# Patient Record
Sex: Male | Born: 1955 | Race: Black or African American | Hispanic: No | Smoking: Former smoker
Health system: Southern US, Community
[De-identification: ages and names within clinical notes are randomized; demographics above are authoritative.]

## PROBLEM LIST (undated history)

## (undated) DIAGNOSIS — E119 Type 2 diabetes mellitus without complications: Secondary | ICD-10-CM

## (undated) DIAGNOSIS — L409 Psoriasis, unspecified: Secondary | ICD-10-CM

---

## 1999-12-15 ENCOUNTER — Emergency Department (HOSPITAL_COMMUNITY): Admission: EM | Admit: 1999-12-15 | Discharge: 1999-12-15 | Payer: Self-pay

## 2009-11-10 ENCOUNTER — Ambulatory Visit: Payer: Self-pay | Admitting: Internal Medicine

## 2009-11-10 LAB — CONVERTED CEMR LAB
ALT: 14 units/L (ref 0–53)
AST: 17 units/L (ref 0–37)
Albumin: 4.3 g/dL (ref 3.5–5.2)
Alkaline Phosphatase: 55 units/L (ref 39–117)
Anti Nuclear Antibody(ANA): NEGATIVE
BUN: 8 mg/dL (ref 6–23)
CO2: 27 meq/L (ref 19–32)
CRP: 0.8 mg/dL — ABNORMAL HIGH (ref <0.6)
Calcium: 9.5 mg/dL (ref 8.4–10.5)
Chloride: 102 meq/L (ref 96–112)
Creatinine, Ser: 1.09 mg/dL (ref 0.40–1.50)
Folate: 7.8 ng/mL
Glucose, Bld: 108 mg/dL — ABNORMAL HIGH (ref 70–99)
HCV Ab: NEGATIVE
Hep A Total Ab: NEGATIVE
Hep B Core Total Ab: NEGATIVE
Hep B S Ab: POSITIVE — AB
Hgb A1c MFr Bld: 6.9 % — ABNORMAL HIGH (ref 4.6–6.1)
Iron: 95 ug/dL (ref 42–165)
Potassium: 4.2 meq/L (ref 3.5–5.3)
Saturation Ratios: 35 % (ref 20–55)
Sodium: 141 meq/L (ref 135–145)
TIBC: 270 ug/dL (ref 215–435)
TSH: 2.354 microintl units/mL (ref 0.350–4.500)
Total Bilirubin: 0.3 mg/dL (ref 0.3–1.2)
Total Protein: 7.7 g/dL (ref 6.0–8.3)
UIBC: 175 ug/dL
Uric Acid, Serum: 9.1 mg/dL — ABNORMAL HIGH (ref 4.0–7.8)
Vitamin B-12: 731 pg/mL (ref 211–911)

## 2009-12-08 ENCOUNTER — Ambulatory Visit: Payer: Self-pay | Admitting: Internal Medicine

## 2009-12-12 ENCOUNTER — Ambulatory Visit: Payer: Self-pay | Admitting: Internal Medicine

## 2009-12-29 ENCOUNTER — Ambulatory Visit: Payer: Self-pay | Admitting: Internal Medicine

## 2010-01-26 ENCOUNTER — Ambulatory Visit: Payer: Self-pay | Admitting: Internal Medicine

## 2010-01-26 LAB — CONVERTED CEMR LAB: Microalb, Ur: 1.08 mg/dL (ref 0.00–1.89)

## 2010-04-02 ENCOUNTER — Ambulatory Visit: Payer: Self-pay | Admitting: Internal Medicine

## 2010-04-02 LAB — CONVERTED CEMR LAB
Cholesterol: 205 mg/dL — ABNORMAL HIGH (ref 0–200)
HDL: 42 mg/dL (ref >39)
Hgb A1c MFr Bld: 6.8 % — ABNORMAL HIGH (ref <5.7)
LDL Cholesterol: 119 mg/dL — ABNORMAL HIGH (ref 0–99)
PSA: 0.46 ng/mL (ref 0.10–4.00)
Testosterone: 340.96 ng/dL — ABNORMAL LOW (ref 350–890)
Total CHOL/HDL Ratio: 4.9
Triglycerides: 218 mg/dL — ABNORMAL HIGH (ref <150)
VLDL: 44 mg/dL — ABNORMAL HIGH (ref 0–40)

## 2010-04-05 ENCOUNTER — Emergency Department (HOSPITAL_COMMUNITY): Admission: EM | Admit: 2010-04-05 | Discharge: 2010-04-05 | Payer: Self-pay | Admitting: Emergency Medicine

## 2010-08-03 ENCOUNTER — Emergency Department (HOSPITAL_COMMUNITY)
Admission: EM | Admit: 2010-08-03 | Discharge: 2010-08-03 | Payer: Self-pay | Source: Home / Self Care | Admitting: Emergency Medicine

## 2010-10-22 LAB — URINALYSIS, ROUTINE W REFLEX MICROSCOPIC
Bilirubin Urine: NEGATIVE
Glucose, UA: NEGATIVE mg/dL
Ketones, ur: NEGATIVE mg/dL
Nitrite: NEGATIVE
Protein, ur: NEGATIVE mg/dL
Specific Gravity, Urine: 1.018 (ref 1.005–1.030)
Urobilinogen, UA: 1 mg/dL (ref 0.0–1.0)
pH: 5.5 (ref 5.0–8.0)

## 2010-10-22 LAB — URINE CULTURE

## 2010-10-22 LAB — URINE MICROSCOPIC-ADD ON

## 2014-06-07 ENCOUNTER — Emergency Department (HOSPITAL_COMMUNITY)
Admission: EM | Admit: 2014-06-07 | Discharge: 2014-06-07 | Disposition: A | Discharge status: Home / Self Care | Payer: Self-pay | Attending: Emergency Medicine | Admitting: Emergency Medicine

## 2014-06-07 ENCOUNTER — Encounter (HOSPITAL_COMMUNITY): Payer: Self-pay | Admitting: Emergency Medicine

## 2014-06-07 DIAGNOSIS — L0211 Cutaneous abscess of neck: Secondary | ICD-10-CM | POA: Insufficient documentation

## 2014-06-07 DIAGNOSIS — Z87891 Personal history of nicotine dependence: Secondary | ICD-10-CM | POA: Insufficient documentation

## 2014-06-07 MED ORDER — SULFAMETHOXAZOLE-TRIMETHOPRIM 800-160 MG PO TABS: 1.0000 | ORAL_TABLET | Freq: Two times a day (BID) | ORAL | Status: AC

## 2014-06-07 NOTE — ED Notes (Signed)
Pt presents with abscess to L side of neck.  Pt reports getting bit by insect but did not see insect or feel a bite.  Pt reports drainage to area.

## 2014-06-07 NOTE — ED Provider Notes (Signed)
CSN: [id number]     Arrival date & time 06/07/14  1038 History  This chart was scribed for non-physician practitioner, Trixie DredgeEmily Marelly Wehrman, PA-C working with Flint MelterElliott L Wentz, MD by Greggory StallionKayla Andersen, ED scribe. This patient was seen in room TR09C/TR09C and the patient's care was started at 10:53 AM.   Chief Complaint  Patient presents with  . Insect Bite   The history is provided by the patient. No language interpreter was used.   HPI Comments: Casey Bell is a 58 y.o. male who presents to the Emergency Department complaining of an insect bite to his left neck that occurred 3 days ago. Pt states he never actually saw an insect but felt something bite him. He has had mild pain and worsening swelling to his neck. Reports pus drainage from the area that started yesterday. Denies fever, trouble swallowing, sore throat, chest pain, SOB, trouble breathing, nausea, emesis, rashes.   History reviewed. No pertinent past medical history. History reviewed. No pertinent past surgical history. History reviewed. No pertinent family history. History  Substance Use Topics  . Smoking status: Former Games developermoker  . Smokeless tobacco: Not on file  . Alcohol Use: Yes    Review of Systems  Constitutional: Negative for fever.  HENT: Negative for sore throat and trouble swallowing.   Respiratory: Negative for shortness of breath.   Cardiovascular: Negative for chest pain.  Gastrointestinal: Negative for nausea and vomiting.  Skin: Positive for wound. Negative for rash.  All other systems reviewed and are negative.  Allergies  Review of patient's allergies indicates no known allergies.  Home Medications   Prior to Admission medications   Not on File   BP 171/99  Pulse 86  Temp(Src) 98.3 F (36.8 C) (Oral)  Resp 18  Ht 5\' 11"  (1.803 m)  Wt 220 lb (99.791 kg)  BMI 30.70 kg/m2  SpO2 94%  Physical Exam  Nursing note and vitals reviewed. Constitutional: He appears well-developed and well-nourished. No  distress.  HENT:  Head: Normocephalic and atraumatic.  Neck: Neck supple.  Pulmonary/Chest: Effort normal.  Neurological: He is alert.  Skin: He is not diaphoretic.  Left anterior neck with oblong area of induration with area overlying ulceration. No active discharge. No surrounding erythema or warmth. No area of fluctuance.    ED Course  Procedures (including critical care time)  DIAGNOSTIC STUDIES: Oxygen Saturation is 94% on RA, adequate by my interpretation.    COORDINATION OF CARE: 10:57 AM-Advised pt area does not need to be I&D'd since it is already draining on its own. Discussed treatment plan which includes antibiotics and warm compresses with pt at bedside and pt agreed to plan. Return precautions given.   Labs Review Labs Reviewed - No data to display  Imaging Review No results found.   EKG Interpretation None      MDM   Final diagnoses:  Neck abscess    Afebrile, nontoxic patient with left anterior neck abscess that has drained spontaneously.  NO systemic symptoms.  There does not appear to be any need to I&D with the opening as large as it is.  By my exam it is confined to the superficial soft tissue and is mobile, does not involve deeper structures.   D/C home with bactrim, instructions for warm moist compresses to encourage any further drainage.  Discussed strict return precautions.  Discussed result, findings, treatment, and follow up  with patient.  Pt given return precautions.  Pt verbalizes understanding and agrees with plan.  I personally performed the services described in this documentation, which was scribed in my presence. The recorded information has been reviewed and is accurate.  Trixie Dredgemily Jessicah Croll, PA-C 06/07/14 1144

## 2014-06-07 NOTE — Discharge Instructions (Signed)
Read the information below.  Use the prescribed medication as directed.  Please discuss all new medications with your pharmacist.  You may return to the Emergency Department at any time for worsening condition or any new symptoms that concern you.  If you develop redness, swelling, pus draining from the wound, or fevers greater than 100.4, return to the ER immediately for a recheck.    Abscess An abscess is an infected area that contains a collection of pus and debris.It can occur in almost any part of the body. An abscess is also known as a furuncle or boil. CAUSES  An abscess occurs when tissue gets infected. This can occur from blockage of oil or sweat glands, infection of hair follicles, or a minor injury to the skin. As the body tries to fight the infection, pus collects in the area and creates pressure under the skin. This pressure causes pain. People with weakened immune systems have difficulty fighting infections and get certain abscesses more often.  SYMPTOMS Usually an abscess develops on the skin and becomes a painful mass that is red, warm, and tender. If the abscess forms under the skin, you may feel a moveable soft area under the skin. Some abscesses break open (rupture) on their own, but most will continue to get worse without care. The infection can spread deeper into the body and eventually into the bloodstream, causing you to feel ill.  DIAGNOSIS  Your caregiver will take your medical history and perform a physical exam. A sample of fluid may also be taken from the abscess to determine what is causing your infection. TREATMENT  Your caregiver may prescribe antibiotic medicines to fight the infection. However, taking antibiotics alone usually does not cure an abscess. Your caregiver may need to make a small cut (incision) in the abscess to drain the pus. In some cases, gauze is packed into the abscess to reduce pain and to continue draining the area. HOME CARE INSTRUCTIONS   Only take  over-the-counter or prescription medicines for pain, discomfort, or fever as directed by your caregiver.  If you were prescribed antibiotics, take them as directed. Finish them even if you start to feel better.  If gauze is used, follow your caregiver's directions for changing the gauze.  To avoid spreading the infection:  Keep your draining abscess covered with a bandage.  Wash your hands well.  Do not share personal care items, towels, or whirlpools with others.  Avoid skin contact with others.  Keep your skin and clothes clean around the abscess.  Keep all follow-up appointments as directed by your caregiver. SEEK MEDICAL CARE IF:   You have increased pain, swelling, redness, fluid drainage, or bleeding.  You have muscle aches, chills, or a general ill feeling.  You have a fever. MAKE SURE YOU:   Understand these instructions.  Will watch your condition.  Will get help right away if you are not doing well or get worse. Document Released: 05/08/2005 Document Revised: 01/28/2012 Document Reviewed: 10/11/2011 [facility] Patient Information 2015 [place], [place]. This information is not intended to replace advice given to you by your health care provider. Make sure you discuss any questions you have with your health care provider.  Abscess Care After An abscess (also called a boil or furuncle) is an infected area that contains a collection of pus. Signs and symptoms of an abscess include pain, tenderness, redness, or hardness, or you may feel a moveable soft area under your skin. An abscess can occur anywhere in the  body. The infection may spread to surrounding tissues causing cellulitis. A cut (incision) by the surgeon was made over your abscess and the pus was drained out. Gauze may have been packed into the space to provide a drain that will allow the cavity to heal from the inside outwards. The boil may be painful for 5 to 7 days. Most people with a boil do not have high fevers.  Your abscess, if seen early, may not have localized, and may not have been lanced. If not, another appointment may be required for this if it does not get better on its own or with medications. HOME CARE INSTRUCTIONS   Only take over-the-counter or prescription medicines for pain, discomfort, or fever as directed by your caregiver.  When you bathe, soak and then remove gauze or iodoform packs at least daily or as directed by your caregiver. You may then wash the wound gently with mild soapy water. Repack with gauze or do as your caregiver directs. SEEK IMMEDIATE MEDICAL CARE IF:   You develop increased pain, swelling, redness, drainage, or bleeding in the wound site.  You develop signs of generalized infection including muscle aches, chills, fever, or a general ill feeling.  An oral temperature above 102 F (38.9 C) develops, not controlled by medication. See your caregiver for a recheck if you develop any of the symptoms described above. If medications (antibiotics) were prescribed, take them as directed. Document Released: 02/14/2005 Document Revised: 10/21/2011 Document Reviewed: 10/12/2007 [facility]ExitCare Patient Information 2015 [place], [place]. This information is not intended to replace advice given to you by your health care provider. Make sure you discuss any questions you have with your health care provider.

## 2014-06-08 NOTE — ED Provider Notes (Signed)
Medical screening examination/treatment/procedure(s) were performed by non-physician practitioner and as supervising physician I was immediately available for consultation/collaboration.   EKG Interpretation None       Zakry Caso L Ruberta Holck, MD 06/08/14 1110 

## 2014-07-05 ENCOUNTER — Emergency Department (HOSPITAL_COMMUNITY)
Admission: EM | Admit: 2014-07-05 | Discharge: 2014-07-05 | Disposition: A | Discharge status: Home / Self Care | Payer: Self-pay | Attending: Emergency Medicine | Admitting: Emergency Medicine

## 2014-07-05 ENCOUNTER — Encounter (HOSPITAL_COMMUNITY): Payer: Self-pay

## 2014-07-05 DIAGNOSIS — Y9289 Other specified places as the place of occurrence of the external cause: Secondary | ICD-10-CM | POA: Insufficient documentation

## 2014-07-05 DIAGNOSIS — T7840XA Allergy, unspecified, initial encounter: Secondary | ICD-10-CM | POA: Insufficient documentation

## 2014-07-05 DIAGNOSIS — T3695XA Adverse effect of unspecified systemic antibiotic, initial encounter: Secondary | ICD-10-CM | POA: Insufficient documentation

## 2014-07-05 DIAGNOSIS — L0291 Cutaneous abscess, unspecified: Secondary | ICD-10-CM

## 2014-07-05 DIAGNOSIS — Y998 Other external cause status: Secondary | ICD-10-CM | POA: Insufficient documentation

## 2014-07-05 DIAGNOSIS — L0211 Cutaneous abscess of neck: Secondary | ICD-10-CM | POA: Insufficient documentation

## 2014-07-05 DIAGNOSIS — L259 Unspecified contact dermatitis, unspecified cause: Secondary | ICD-10-CM

## 2014-07-05 DIAGNOSIS — Y9389 Activity, other specified: Secondary | ICD-10-CM | POA: Insufficient documentation

## 2014-07-05 DIAGNOSIS — Z87891 Personal history of nicotine dependence: Secondary | ICD-10-CM | POA: Insufficient documentation

## 2014-07-05 MED ORDER — HYDROCORTISONE 1 % EX CREA: TOPICAL_CREAM | CUTANEOUS | Status: DC

## 2014-07-05 MED ORDER — LIDOCAINE HCL 2 % IJ SOLN
10.0000 mL | Freq: Once | INTRAMUSCULAR | Status: AC
  Administered 2014-07-05: 200 mg via INTRADERMAL
  Filled 2014-07-05: qty 20

## 2014-07-05 MED ORDER — CLINDAMYCIN HCL 150 MG PO CAPS: 450.0000 mg | ORAL_CAPSULE | Freq: Three times a day (TID) | ORAL | Status: DC

## 2014-07-05 NOTE — Discharge Instructions (Signed)
Contact Dermatitis °Contact dermatitis is a reaction to certain substances that touch the skin. Contact dermatitis can be either irritant contact dermatitis or allergic contact dermatitis. Irritant contact dermatitis does not require previous exposure to the substance for a reaction to occur. Allergic contact dermatitis only occurs if you have been exposed to the substance before. Upon a repeat exposure, your body reacts to the substance.  °CAUSES  °Many substances can cause contact dermatitis. Irritant dermatitis is most commonly caused by repeated exposure to mildly irritating substances, such as: °· Makeup. °· Soaps. °· Detergents. °· Bleaches. °· Acids. °· Metal salts, such as nickel. °Allergic contact dermatitis is most commonly caused by exposure to: °· Poisonous plants. °· Chemicals (deodorants, shampoos). °· Jewelry. °· Latex. °· Neomycin in triple antibiotic cream. °· Preservatives in products, including clothing. °SYMPTOMS  °The area of skin that is exposed may develop: °· Dryness or flaking. °· Redness. °· Cracks. °· Itching. °· Pain or a burning sensation. °· Blisters. °With allergic contact dermatitis, there may also be swelling in areas such as the eyelids, mouth, or genitals.  °DIAGNOSIS  °Your caregiver can usually tell what the problem is by doing a physical exam. In cases where the cause is uncertain and an allergic contact dermatitis is suspected, a patch skin test may be performed to help determine the cause of your dermatitis. °TREATMENT °Treatment includes protecting the skin from further contact with the irritating substance by avoiding that substance if possible. Barrier creams, powders, and gloves may be helpful. Your caregiver may also recommend: °· Steroid creams or ointments applied 2 times daily. For best results, soak the rash area in cool water for 20 minutes. Then apply the medicine. Cover the area with a plastic wrap. You can store the steroid cream in the refrigerator for a "chilly"  effect on your rash. That may decrease itching. Oral steroid medicines may be needed in more severe cases. °· Antibiotics or antibacterial ointments if a skin infection is present. °· Antihistamine lotion or an antihistamine taken by mouth to ease itching. °· Lubricants to keep moisture in your skin. °· Burow's solution to reduce redness and soreness or to dry a weeping rash. Mix one packet or tablet of solution in 2 cups cool water. Dip a clean washcloth in the mixture, wring it out a bit, and put it on the affected area. Leave the cloth in place for 30 minutes. Do this as often as possible throughout the day. °· Taking several cornstarch or baking soda baths daily if the area is too large to cover with a washcloth. °Harsh chemicals, such as alkalis or acids, can cause skin damage that is like a burn. You should flush your skin for 15 to 20 minutes with cold water after such an exposure. You should also seek immediate medical care after exposure. Bandages (dressings), antibiotics, and pain medicine may be needed for severely irritated skin.  °HOME CARE INSTRUCTIONS °· Avoid the substance that caused your reaction. °· Keep the area of skin that is affected away from hot water, soap, sunlight, chemicals, acidic substances, or anything else that would irritate your skin. °· Do not scratch the rash. Scratching may cause the rash to become infected. °· You may take cool baths to help stop the itching. °· Only take over-the-counter or prescription medicines as directed by your caregiver. °· See your caregiver for follow-up care as directed to make sure your skin is healing properly. °SEEK MEDICAL CARE IF:  °· Your condition is not better after 3   days of treatment.  You seem to be getting worse.  You see signs of infection such as swelling, tenderness, redness, soreness, or warmth in the affected area.  You have any problems related to your medicines. Document Released: 07/26/2000 Document Revised: 10/21/2011  Document Reviewed: 01/01/2011 [facility] Patient Information 2015 [place], [place]. This information is not intended to replace advice given to you by your health care provider. Make sure you discuss any questions you have with your health care provider.   Emergency Department Resource Guide 1) Find a Doctor and Pay Out of Pocket Although you won't have to find out who is covered by your insurance plan, it is a good idea to ask around and get recommendations. You will then need to call the office and see if the doctor you have chosen will accept you as a new patient and what types of options they offer for patients who are self-pay. Some doctors offer discounts or will set up payment plans for their patients who do not have insurance, but you will need to ask so you aren't surprised when you get to your appointment.  2) Contact Your Local Health Department Not all health departments have doctors that can see patients for sick visits, but many do, so it is worth a call to see if yours does. If you don't know where your local health department is, you can check in your phone book. The CDC also has a tool to help you locate your state's health department, and many state websites also have listings of all of their local health departments.  3) Find a [facility] If your illness is not likely to be very severe or complicated, you may want to try a walk in clinic. These are popping up all over the country in pharmacies, drugstores, and shopping centers. They're usually staffed by nurse practitioners or physician assistants that have been trained to treat common illnesses and complaints. They're usually fairly quick and inexpensive. However, if you have serious medical issues or chronic medical problems, these are probably not your best option.  No Primary Care Doctor: - Call Health Connect at  [phone] - they can help you locate a primary care doctor that  accepts your insurance, provides certain services,  etc. - Physician Referral Service- [phone]  Chronic Pain Problems: Organization         Address  Phone   Notes  [facility]  [phone] Patients need to be referred by their primary care doctor.   Medication Assistance: Organization         Address  Phone   Notes  [facility] [address] [place], [place] [address] [phone] --Must be a resident of [facility] -- Must have NO insurance coverage whatsoever (no Medicaid/ Medicare, etc.) -- The pt. MUST have a primary care doctor that directs their care regularly and follows them in the community   [facility]  [phone]   [employer]  [phone]    Agencies that provide inexpensive medical care: Organization         Address  Phone   Notes  [place]  [phone]   [facility]    [phone]   [facility] [place], [place] [address] [phone]   [place] [address] [place] [phone]   [facility]anned [facility]    ([phone]) [phone]   [facility]    ([phone]) [phone]   °[facility] ° [address], [place] Phone:  ([phone]) [phone], Fax:  ([phone] Hours of Operation:  9 am - 6 pm, M-F.  Also accepts Medicaid/Medicare and self-pay.  °[facility] ° [address], [place] Phone: ([phone]) [phone], Fax: [phone]) [phone]. Hours of Operation:  8:30 am - 5:30 pm, M-F.  Also accepts Medicaid and self-pay.  °[facility] [place] [address], [place] Phone: ([phone]) [phone]   °[facility] [address], [place], [place] (336)[phone], Ext. 123 Mondays & Thursdays: 7-9 AM.  First 15 patients are seen on a first come, first serve basis. °  ° °Medicaid-accepting [place] Providers: ° °Organization         Address  Phone   Notes  °[facility] [address], [place] ([phone]) [phone] Also accepts self-pay patients.  °[facility] [address], [place] ° (336) [phone]   °[facility] [address], [place] ([phone]) [phone]   °[facility] [address] [place] [address], [place] (336) [phone]   °Veita Bland [address], [place]  ° ([phone]) [phone] Only accepts  Access Medicaid patients after they have their name applied to their card.  ° °Self-Pay (no insurance) in [place]: ° °Organization         Address  Phone   Notes  °Sickle Cell Patients, [facility] [address], [place] (336) [phone]   °[facility] [address], [place] (336) [phone]   °[facility] ° [address], [place] ([phone]) [phone]   °[facility]/Dr. Osei-Bonsu ° [address] [place] [address], [place] or [address], [place] ([phone]) [phone] Phone number for both [place] and [place] locations is the same.  °[facility] [address], [place] ([phone]) [phone]   °[facility] [address] [place] [address], [place] or [address] (336) [phone] °(336) [phone]   °[facility] [address], [place] (336) [phone], phone; ([phone]) [phone], fax Sees patients 1st and 3rd Saturday of every month.  Must not qualify for public or private insurance (i.e. Medicaid, Medicare, [place] Health Choice, Veterans' Benefits) • Household income should be no more than 200% of the poverty level •The clinic cannot treat you if you are pregnant or think you are pregnant • Sexually transmitted diseases are not treated at the clinic.  ° ° °Dental Care: °Organization         Address  Phone  Notes  °[place] [facility] [address],  (336) [phone] Accepts children up to  age 21 who are enrolled in Medicaid or [place] Health Choice; pregnant women with a Medicaid card; and children who have applied for Medicaid or [place] Health Choice, but were declined, whose parents can pay a reduced fee at time of service.  °[place] [facility] [place]  [address], [place] (336) 641-7733 Accepts children up to age 21 who are enrolled in Medicaid or [place] Health Choice; pregnant women with a Medicaid card; and children who have applied for Medicaid or [place] Health Choice, but were declined, whose parents can pay a reduced fee at time of service.  °  [facility] ° [address], [place] (336) [phone] Patients are seen by appointment only. Walk-ins are not accepted. [facility] will see patients 18 years of age and older. °Monday - Tuesday (8am-5pm) °Most Wednesdays (8:30-5pm) °$30 per visit, cash only  °[facility] ° [address], [place] ([phone]) [phone] Patients are seen by appointment only. Walk-ins are not accepted. [facility] will see patients 18 years of age and older. °One Wednesday Evening (Monthly: Volunteer Based).  $30 per visit, cash only  °[facility]  ([phone]) [phone] for adults; Children under age 4, call Graduate Pediatric Dentistry at ([phone]. Children aged 4-14, please call [phone] to request a pediatric application. ° Dental services are provided in all areas of dental care including fillings, crowns and bridges, complete and partial dentures, implants, gum treatment, root canals, and extractions. Preventive care is also provided. Treatment is provided to both adults and children. °Patients are selected via a lottery and there is often a waiting list. °  °[facility] [address], °[place] ° (336) 763-8833 [email or web address] °  °[facility] [address], [place], [place] (336)723-1848, Ext. 123 Second and Fourth Thursday of  each month, opens at 6:30 AM; Clinic ends at 9 AM.  Patients are seen on a first-come first-served basis, and a limited number are seen during each clinic.  ° °[facility] ° [address], [place], [place] (336) [phone]   Eligibility Requirements °You must have lived in [place], [place], or [place] counties for at least the last three months. °  You cannot be eligible for state or federal sponsored healthcare insurance, including Veterans Administration, Medicaid, or Medicare. °  You generally cannot be eligible for healthcare insurance through your employer.  °  How to apply: °Eligibility screenings are held every Tuesday and Wednesday afternoon from 1:00 pm until 4:00 pm. You do not need an appointment for the interview!  °[facility] [address], [place], [place] [phone]   °[facility]  336-342-8273   °[facility]  336-703-3100   °[facility]  336-570-6415   ° °Behavioral Health Resources in the Community: °Intensive Outpatient Programs °Organization         Address  Phone  Notes  °[facility] [address], [place], [place] 336-878-6098   °[facility] [address], [place], [place] [phone]   °[facility] [address], [place], [place] ° 336-882-2125   °[facility] [address],  °[place], [place] 1-800-853-5163 or 336-641-4981   °Substance Abuse Resources °Organization         Address  Phone  Notes  °[facility]  336-882-2125   °[facility]  336-784-9470   °[facility]  336-285-9073   °[facility]  336-845-3988   °Residential & Outpatient Substance Abuse Program  1-800-659-3381   °Psychological Services °Organization         Address  Phone  Notes  °[facility]  336- 832-9600   °[facility]  336- 378-7881   °[facility] [address],  [place] 1-800-853-5163 or 336-641-4981   ° °Mobile Crisis Teams °Organization         Address  Phone  Notes  °[facility], Mobile Crisis Care Unit  1-877-626-1772   °Assertive °Psychotherapeutic Services ° [address] [place], [place] [phone]   [facility] [address] [place] [phone]    Self-Help/Support Groups Organization         Address  Phone             Notes  [facility] [place] - variety of support groups  336- [phone] Call for more information  Narcotics Anonymous (NA), [facility] [address], [place]  2 meetings at this location   [facility] Organization         Address  Phone  Notes  [facility] [address],    [place]  [phone]   [facility]  [address], [place] [phone], [place], [place] [phone]   [facility] [address], [place] [phone] Admissions: 8am-3pm M-F  [facility] [address]    [place], [place] [phone]   [facility] [address], [place], [place] [phone]   [facility] [address]  [place], [place] [phone]   [facility] Intensive Outpatient [address], [place], [place] [phone]   [facility] ([facility] [address]  [place], [place] [phone] or [phone]   [facility] ([facility]) [address] [place], [place] [phone] Accepts Medicaid  [facility] [address]  [place] [phone] Substance Abuse/Addiction Treatment   [facility] Resources Organization         Address  Phone  Notes  [facility]  [phone](888) [phone]   Angie FavaJulie Brannon, PhD [address], [address] [place], [place]   [phone](336) [phone] or [phone]) [phone]   [facility] [address]   [address] [place], [place] [phone](336) [phone]     [facility] [address], [place], [place] [phone](336) [phone] Insurance/Medicaid/sponsorship through [facility]  [facility] [address]                                    [place], [place] [phone](336) [phone] Therapy/tele-psych/case  [facility] [address]   [place], [place] [phone]    Dr. Lolly MustacheArfeen  [phone]) [phone]   [facility]  [facility] 1) [address], [place] 2) [address], [place] 3)  [address], [place] [phone](336) [phone]  [phone](336) [phone]   [facility] Child Abuse Hotline [phone](336) [phone] or [phone](336) [phone] (After Hours)       Abscess An abscess is an infected area that contains a collection of pus and debris.It can occur in almost any part of the body. An abscess is also known as a furuncle or boil. CAUSES  An abscess occurs when tissue gets infected. This can occur from blockage of oil or sweat glands, infection of hair follicles, or a minor injury to the skin. As the body tries to fight the infection, pus collects in the area and creates pressure under the skin. This pressure causes pain. People with weakened immune systems have difficulty fighting infections and get certain abscesses more often.  SYMPTOMS Usually an abscess develops on the skin and becomes a painful mass that is red, warm, and tender. If the abscess forms under the skin, you may feel a moveable soft area under the skin. Some abscesses break open (rupture) on their  own, but most will continue to get worse without care. The infection can spread deeper into the body and eventually into the bloodstream, causing you to feel ill.  DIAGNOSIS  Your caregiver will take your medical history and perform a physical exam. A sample of fluid may also be taken from the abscess to determine what is causing your infection. TREATMENT  Your caregiver may prescribe antibiotic medicines to fight the infection. However, taking  antibiotics alone usually does not cure an abscess. Your caregiver may need to make a small cut (incision) in the abscess to drain the pus. In some cases, gauze is packed into the abscess to reduce pain and to continue draining the area. HOME CARE INSTRUCTIONS   Only take over-the-counter or prescription medicines for pain, discomfort, or fever as directed by your caregiver.  If you were prescribed antibiotics, take them as directed. Finish them even if you start to feel better.  If gauze is used, follow your caregiver's directions for changing the gauze.  To avoid spreading the infection:  Keep your draining abscess covered with a bandage.  Wash your hands well.  Do not share personal care items, towels, or whirlpools with others.  Avoid skin contact with others.  Keep your skin and clothes clean around the abscess.  Keep all follow-up appointments as directed by your caregiver. SEEK MEDICAL CARE IF:   You have increased pain, swelling, redness, fluid drainage, or bleeding.  You have muscle aches, chills, or a general ill feeling.  You have a fever. MAKE SURE YOU:   Understand these instructions.  Will watch your condition.  Will get help right away if you are not doing well or get worse. Document Released: 05/08/2005 Document Revised: 01/28/2012 Document Reviewed: 10/11/2011 [facility] Patient Information 2015 [place]. This information is not intended to replace advice given to you by your health care provider. Make sure you discuss any questions you have with your health care provider.

## 2014-07-05 NOTE — ED Notes (Addendum)
Pt. Was seen by us 10/27 for an abscess on his neck.  He took the antibiotics and completed them.  In the last few days he has developed 4 -5 abscesses all around his neck.  Neck  Feels tight.  Ear lobes are swollen .  Pt. Also has hives all over his torso which began about 2 weeks ago.  Airway patent. No difficulty swallowing or sob.

## 2014-07-05 NOTE — ED Provider Notes (Signed)
CSN: [id number]     Arrival date & time 07/05/14  1528 History   First MD Initiated Contact with Patient 07/05/14 1640     Chief Complaint  Patient presents with  . Allergic Reaction    abscesses     (Consider location/radiation/quality/duration/timing/severity/associated sxs/prior Treatment) HPI Comments: Was treated for abscess of his neck with Bactrim. Did not get an I&D. He also had a large breakout over his lower back and shoulders and occasionally distribution. These have all healed over and are now scars that are lighter in color than his skin. He has not changed soaps, loans, detergents.  Patient is a 58 y.o. male presenting with allergic reaction. The history is provided by the patient.  Allergic Reaction Presenting symptoms: rash   Presenting symptoms: no difficulty breathing, no difficulty swallowing and no itching   Rash:    Location:  Neck and face   Quality: itchiness, painful and redness     Severity:  Moderate   Onset quality:  Gradual   Timing:  Constant   Progression:  Worsening Severity:  Mild   History reviewed. No pertinent past medical history. History reviewed. No pertinent past surgical history. No family history on file. History  Substance Use Topics  . Smoking status: Former Games developermoker  . Smokeless tobacco: Not on file  . Alcohol Use: Yes    Review of Systems  Constitutional: Negative for fever.  HENT: Negative for trouble swallowing and voice change.   Respiratory: Negative for cough and shortness of breath.   Cardiovascular: Negative for chest pain and leg swelling.  Gastrointestinal: Negative for vomiting and abdominal pain.  Skin: Positive for rash. Negative for itching.  All other systems reviewed and are negative.     Allergies  Review of patient's allergies indicates no known allergies.  Home Medications   Prior to Admission medications   Medication Sig Start Date End Date Taking? Authorizing Provider  naproxen sodium (ANAPROX)  220 MG tablet Take 440 mg by mouth daily as needed (for pain).    Yes Historical Provider, MD   BP 112/99 mmHg  Pulse 83  Temp(Src) 98.5 F (36.9 C) (Oral)  Resp 20  SpO2 97% Physical Exam  Constitutional: He is oriented to person, place, and time. He appears well-developed and well-nourished. No distress.  HENT:  Head: Normocephalic and atraumatic.    Mouth/Throat: No oropharyngeal exudate.  Eyes: EOM are normal. Pupils are equal, round, and reactive to light.  Neck: Normal range of motion. Neck supple.  Cardiovascular: Normal rate and regular rhythm.  Exam reveals no friction rub.   No murmur heard. Pulmonary/Chest: Effort normal and breath sounds normal. No respiratory distress. He has no wheezes. He has no rales.  Abdominal: He exhibits no distension. There is no tenderness. There is no rebound.  Musculoskeletal: Normal range of motion. He exhibits no edema.  Neurological: He is alert and oriented to person, place, and time.  Skin: Lesion (scaling, eczematous rash around neck. No induration, no erythema, no swelling.) noted. No purpura noted. Rash is not papular and not nodular. He is not diaphoretic. No erythema.    ED Course  INCISION AND DRAINAGE Date/Time: 07/05/2014 5:43 PM Performed by: Elwin MochaWALDEN, Shepard Keltz Authorized by: Elwin MochaWALDEN, Gwenith Tschida Consent: Verbal consent obtained. Type: abscess Body area: head/neck Location details: neck Anesthesia: local infiltration Local anesthetic: lidocaine 2% without epinephrine Anesthetic total: 1 ml Patient sedated: no Risk factor: underlying major vessel Scalpel size: 11 Incision type: single straight Complexity: simple Drainage: purulent Drainage amount: scant Wound  treatment: wound left open Comments: 2 abscess with single stab incision on each. One on R lateral neck, on jsut above sternal notch on anterior neck midline.   (including critical care time) Labs Review Labs Reviewed - No data to display  Imaging Review No results  found.   EKG Interpretation None      MDM   Final diagnoses:  Abscess  Contact dermatitis    58 year old male here with 2 very small pinpoint abscesses on his back. He developed these after he had a scaling rash encircling his neck. No other abscesses in his neck. No concern for airway patency as he complains of nothing. No shortness of breath, no difficulty breathing, no difficult swallowing, no voice changes. On exam he has eczematous type rash along and around entire neck and 2 small pinpoint abscesses one on right lateral neck and one just above the sternal notch. I did local anesthetic and did just a single stab incision on each with very scant purulence. Will put patient on clindamycin, no he did just recently finished Bactrim. Will also give hydrocortisone for the eczema. Given the distribution, I feel like this is likely contact dermatitis. Cannot find etiology for his eczema, no new detergents, soaps, jewelry, life changes.     Elwin MochaBlair Aariyah Sampey, MD 07/06/14 [phone]

## 2017-08-22 ENCOUNTER — Emergency Department (HOSPITAL_COMMUNITY)
Admission: EM | Admit: 2017-08-22 | Discharge: 2017-08-23 | Disposition: A | Discharge status: Home / Self Care | Payer: Self-pay | Attending: Emergency Medicine | Admitting: Emergency Medicine

## 2017-08-22 ENCOUNTER — Encounter (HOSPITAL_COMMUNITY): Payer: Self-pay | Admitting: Emergency Medicine

## 2017-08-22 DIAGNOSIS — E119 Type 2 diabetes mellitus without complications: Secondary | ICD-10-CM | POA: Insufficient documentation

## 2017-08-22 DIAGNOSIS — R21 Rash and other nonspecific skin eruption: Secondary | ICD-10-CM | POA: Insufficient documentation

## 2017-08-22 DIAGNOSIS — Z87891 Personal history of nicotine dependence: Secondary | ICD-10-CM | POA: Insufficient documentation

## 2017-08-22 HISTORY — DX: Psoriasis, unspecified: L40.9

## 2017-08-22 HISTORY — DX: Type 2 diabetes mellitus without complications: E11.9

## 2017-08-22 LAB — I-STAT CHEM 8, ED
BUN: 13 mg/dL (ref 6–20)
CHLORIDE: 100 mmol/L — AB (ref 101–111)
Calcium, Ion: 1.16 mmol/L (ref 1.15–1.40)
Creatinine, Ser: 1.1 mg/dL (ref 0.61–1.24)
GLUCOSE: 162 mg/dL — AB (ref 65–99)
HCT: 48 % (ref 39.0–52.0)
Hemoglobin: 16.3 g/dL (ref 13.0–17.0)
POTASSIUM: 3.7 mmol/L (ref 3.5–5.1)
Sodium: 139 mmol/L (ref 135–145)
TCO2: 27 mmol/L (ref 22–32)

## 2017-08-22 LAB — CBG MONITORING, ED: Glucose-Capillary: 159 mg/dL — ABNORMAL HIGH (ref 65–99)

## 2017-08-22 MED ORDER — CEPHALEXIN 500 MG PO CAPS: 500.0000 mg | ORAL_CAPSULE | Freq: Four times a day (QID) | ORAL | 0 refills | Status: DC

## 2017-08-22 MED ORDER — PREDNISONE 10 MG PO TABS: ORAL_TABLET | ORAL | 0 refills | Status: AC

## 2017-08-22 MED ORDER — METFORMIN HCL 1000 MG PO TABS: 500.0000 mg | ORAL_TABLET | Freq: Two times a day (BID) | ORAL | 0 refills | Status: DC

## 2017-08-22 MED ORDER — METFORMIN HCL 500 MG PO TABS: 500.0000 mg | ORAL_TABLET | Freq: Two times a day (BID) | ORAL | 0 refills | Status: DC

## 2017-08-22 MED ORDER — PREDNISONE 20 MG PO TABS
60.0000 mg | ORAL_TABLET | Freq: Once | ORAL | Status: AC
  Administered 2017-08-23: 60 mg via ORAL
  Filled 2017-08-22: qty 3

## 2017-08-22 NOTE — ED Notes (Signed)
See provider assessment 

## 2017-08-22 NOTE — ED Triage Notes (Signed)
Pt reports psoriasis to entire body X several months, states it has gotten worse recently. On hands, legs, chest, back. Pt thinks he "has an infection inside of him"

## 2017-08-22 NOTE — Discharge Instructions (Signed)
Please see the information and instructions below regarding your visit.  Your diagnoses today include:  1. Rash of entire body    Your rash is consistent with psoriasis.  This requires oral therapy.  We would like you to follow-up with dermatology as well as [facility].  I put in a consult to case management to follow-up with you regarding establishing care.  Tests performed today include: See side panel of your discharge paperwork for testing performed today. Vital signs are listed at the bottom of these instructions.   Medications prescribed:    Take any prescribed medications only as prescribed, and any over the counter medications only as directed on the packaging.  You are prescribed prednisone, a steroid in the ED for @Diagnosis @. This is a medication to help reduce inflammation in the skin.  Common side effects include upset stomach/nausea. You may take this medicine with food if this occurs. Other side effects include restlessness, difficulty sleeping, and increased sweating. Call your healthcare provider if these do not resolve after finishing the medication.  This medicine may increase your blood sugar so additional careful monitoring is needed of blood sugar if you have diabetes. Call your healthcare provider for any signs/symtpoms of high blood sugar such as confusion, feeling sleepy, more thirst, more hunger, passing urine more often, flushing, fast breathing, or breath that smells like fruit.   Please take all of your antibiotics until finished.   You may develop abdominal discomfort or nausea from the antibiotic. If this occurs, you may take it with food. Some patients also get diarrhea with antibiotics. You may help offset this with probiotics which you can buy or get in yogurt. Do not eat or take the probiotics until 2 hours after your antibiotic.   Some people develop allergies to antibiotics. Symptoms of antibiotic allergy can be mild and include a  flat rash and itching. They can also be more serious and include:  ?Hives - Hives are raised, red patches of skin that are usually very itchy.  ?Lip or tongue swelling  ?Trouble swallowing or breathing  ?Blistering of the skin or mouth.  If you have any of these serious symptoms, please seek emergency medical care immediately.   Home care instructions:  Please follow any educational materials contained in this packet.   Follow-up instructions: Please follow-up with your primary care provider for further evaluation of your symptoms if they are not completely improved.  Please follow-up with [facility] regarding her diabetes.  Please follow up with *dermatology in the wellness center regarding your psoriasis.  Return instructions:  Please return to the Emergency Department if you experience worsening symptoms.  Please return to the emergency department if you have any persistent fevers, shortness of breath, chest pain, increasing redness or rash, drainage from your rash. Please return if you have any other emergent concerns.  Additional Information:   Your vital signs today were: BP (!) 156/100 (BP Location: Right Arm)    Pulse 71    Temp 100.3 F (37.9 C) (Oral)    Resp 18    Ht 5\' 11"  (1.803 m)    Wt 93 kg (205 lb)    SpO2 97%    BMI 28.59 kg/m  If your blood pressure (BP) was elevated on multiple readings during this visit above 130 for the top number or above 80 for the bottom number, please have this repeated by your primary care provider within one month. --------------  Thank you  for allowing us to participate in your care today.

## 2017-08-23 NOTE — ED Provider Notes (Signed)
[facility] EMERGENCY DEPARTMENT Provider Note   CSN: [id number] Arrival date & time: 08/22/17  1834     History   Chief Complaint Chief Complaint  Patient presents with  . Psoriasis    HPI Casey Bell is a 61 y.o. male.  HPI  Patient is a 62 y.o male with a history of T2 DM presenting for diffuse scaly rash. Patient reports he has had this rash on extensor surfaces, abdomen, and trunk for approximately 3 years, however it is worsening, and he felt he could not persist with it any more over the last 2 weeks. Patient was previously told 3 years ago at a clinic that he has psoriasis. Patient reports it is pruritic. Patient has tried Eucerin and Aveeno lotions without relief. Patient has never seen a dermatologist and lost his PCP due to the clinic closing. Patient denies fever or chills. Patient has had congestion and rhinorrhea over the past couple days but denies cough or sore throat. Patient does have a history of T2 DM but no other history of immunosuppressed status. Of note, patient reports that he does take Metformin, however since losing his PCP, he gets it from his sister who is now on insulin.   Past Medical History:  Diagnosis Date  . Diabetes mellitus without complication (HCC)   . Psoriasis .    There are no active problems to display for this patient.   History reviewed. No pertinent surgical history.     Home Medications    Prior to Admission medications   Medication Sig Start Date End Date Taking? Authorizing Provider  cephALEXin (KEFLEX) 500 MG capsule Take 1 capsule (500 mg total) by mouth 4 (four) times daily. 08/22/17   Aviva Kluver B, PA-C  clindamycin (CLEOCIN) 150 MG capsule Take 3 capsules (450 mg total) by mouth 3 (three) times daily. 07/05/14   Elwin Mocha, MD  hydrocortisone cream 1 % Apply to affected area 2 times daily 07/05/14   Elwin Mocha, MD  metFORMIN (GLUCOPHAGE) 500 MG tablet Take 1 tablet (500 mg total) by mouth  2 (two) times daily. 08/22/17 09/21/17  Aviva Kluver B, PA-C  naproxen sodium (ANAPROX) 220 MG tablet Take 440 mg by mouth daily as needed (for pain).     [provider]  predniSONE (DELTASONE) 10 MG tablet Take 5 tablets (50 mg total) by mouth daily for 2 days, THEN 4 tablets (40 mg total) daily for 2 days, THEN 3 tablets (30 mg total) daily for 2 days, THEN 2 tablets (20 mg total) daily for 2 days, THEN 1 tablet (10 mg total) daily for 2 days. 08/22/17 09/01/17  Elisha Ponder, PA-C    Family History No family history on file.  Social History Social History   Tobacco Use  . Smoking status: Former Smoker    Last attempt to quit: 2010    Years since quitting: 9.0  . Smokeless tobacco: Never Used  Substance Use Topics  . Alcohol use: Yes    Comment: few/week  . Drug use: No     Allergies   Patient has no known allergies.   Review of Systems Review of Systems  Constitutional: Negative for chills and fever.  HENT: Positive for congestion and rhinorrhea.   Musculoskeletal: Positive for myalgias. Negative for arthralgias.  Skin: Positive for color change and rash.     Physical Exam Updated Vital Signs BP (!) 165/92 (BP Location: Right Arm)   Pulse 93   Temp 100.3 F (37.9  C) (Oral)   Resp 18   Ht 5\' 11"  (1.803 m)   Wt 93 kg (205 lb)   SpO2 99%   BMI 28.59 kg/m   Physical Exam  Constitutional: He appears well-developed and well-nourished. No distress.  Sitting comfortably in bed.  HENT:  Head: Normocephalic and atraumatic.  Eyes: Conjunctivae are normal. Right eye exhibits no discharge. Left eye exhibits no discharge.  EOMs normal to gross examination.  Neck: Normal range of motion.  Cardiovascular: Normal rate, regular rhythm and normal heart sounds.  No murmur heard. Intact, 2+ radial pulse.  Pulmonary/Chest: Effort normal and breath sounds normal. He has no wheezes. He has no rales.  Normal respiratory effort. Patient converses comfortably. No  audible wheeze or stridor.  Abdominal: He exhibits no distension.  Musculoskeletal: Normal range of motion.  Neurological: He is alert.  Cranial nerves intact to gross observation. Patient moves extremities without difficulty.  Skin: He is not diaphoretic.  There are diffuse, confluent raised erythematous plaques with silver scaling over the extensor surfaces of the bilateral upper and lower extremities. Maximum scaling is on the posterior thorax. Scales are de-roofed on bilateral lower extremities and underlying skin is excoriated and  Erythematous. See clinical photo.  Psychiatric: He has a normal mood and affect. His behavior is normal. Judgment and thought content normal.  Nursing note and vitals reviewed.        ED Treatments / Results  Labs (all labs ordered are listed, but only abnormal results are displayed) Labs Reviewed  I-STAT CHEM 8, ED - Abnormal; Notable for the following components:      Result Value   Chloride 100 (*)    Glucose, Bld 162 (*)    All other components within normal limits  CBG MONITORING, ED - Abnormal; Notable for the following components:   Glucose-Capillary 159 (*)    All other components within normal limits    EKG  EKG Interpretation None       Radiology No results found.  Procedures Procedures (including critical care time)  Medications Ordered in ED Medications  predniSONE (DELTASONE) tablet 60 mg (60 mg Oral Given 08/23/17 0018)     Initial Impression / Assessment and Plan / ED Course  I have reviewed the triage vital signs and the nursing notes.  Pertinent labs & imaging results that were available during my care of the patient were reviewed by me and considered in my medical decision making (see chart for details).     Final Clinical Impressions(s) / ED Diagnoses   Final diagnoses:  Rash of entire body   Patient exhibits diffuse plaques and scaling consistent with psoriasis. Patient is nontoxic appearing and in NAD.  Patient is borderline febrile at 100.3. Given that patient is reporting upper respiratory symptoms and myalgias, I believe this is likely upper respiratory and not skin source. Erythema on lower extremities not consistent with infection, but will cover with Keflex. Patient to require dermatologic follow up for severe psoriasis and treatment limited to prednisone at this time. Will prescribe tapers course. Patient has not had follow up for his DM in over a year. CBG 162 today. Will refill Metformin. Renal function WNL. Patient does have blood glucose meter and regularly checks his CBG, which I encouraged him to do while on prednisone therapy. Case management consult placed to assist [facility]. Patient given list of dermatologists. Return precautions for any persistent fevers, increasing skin erythema. Patient is in understanding and  agrees with the plan of care.  This is a supervised visit with Dr. Geoffery Lyons. Evaluation, management, and discharge planning discussed with this attending physician.   Elisha Ponder, PA-C 08/23/17 1151    Geoffery Lyons, MD 08/26/17 2258

## 2017-08-25 NOTE — Care Management Note (Signed)
Case Management Note  CM consulted for no pcp and no ins with need for follow up.  [facility]  Pt can make an appointment himself.  [facility] Erskine SquibbJane to follow for a possible appointment.  No further CM needs noted at this time.

## 2017-08-26 ENCOUNTER — Telehealth: Payer: Self-pay

## 2017-08-26 NOTE — Telephone Encounter (Signed)
Request received from Eldridge AbrahamsAngela Kritzer, RN CM requesting a [facility]. Call placed to the patient and informed him that there are not [facility] at [facility]; but [facility] - [facility]/17 @ 1500.  He said that he would not be able to get to the appointment tomorrow. Provided him with the phone # to call [facility] to schedule an appointment when his schedule allows and he stated that he would call.   Update provided to Breck CoonsA. Kritzer, RN CM

## 2017-09-22 ENCOUNTER — Ambulatory Visit (INDEPENDENT_AMBULATORY_CARE_PROVIDER_SITE_OTHER): Payer: Self-pay | Admitting: Physician Assistant

## 2017-09-22 ENCOUNTER — Encounter (INDEPENDENT_AMBULATORY_CARE_PROVIDER_SITE_OTHER): Payer: Self-pay | Admitting: Physician Assistant

## 2017-09-22 ENCOUNTER — Telehealth: Payer: Self-pay | Admitting: *Deleted

## 2017-09-22 VITALS — BP 128/79 | HR 95 | Temp 98.7°F | Resp 12 | Wt 201.0 lb

## 2017-09-22 DIAGNOSIS — Z23 Encounter for immunization: Secondary | ICD-10-CM

## 2017-09-22 DIAGNOSIS — E119 Type 2 diabetes mellitus without complications: Secondary | ICD-10-CM

## 2017-09-22 DIAGNOSIS — Z79899 Other long term (current) drug therapy: Secondary | ICD-10-CM

## 2017-09-22 DIAGNOSIS — Z1211 Encounter for screening for malignant neoplasm of colon: Secondary | ICD-10-CM

## 2017-09-22 DIAGNOSIS — L409 Psoriasis, unspecified: Secondary | ICD-10-CM

## 2017-09-22 DIAGNOSIS — Z114 Encounter for screening for human immunodeficiency virus [HIV]: Secondary | ICD-10-CM

## 2017-09-22 LAB — GLUCOSE, POCT (MANUAL RESULT ENTRY): POC Glucose: 178 mg/dl — AB (ref 70–99)

## 2017-09-22 LAB — POCT GLYCOSYLATED HEMOGLOBIN (HGB A1C): HEMOGLOBIN A1C: 7.5

## 2017-09-22 MED ORDER — METFORMIN HCL 1000 MG PO TABS: 1000.0000 mg | ORAL_TABLET | Freq: Two times a day (BID) | ORAL | 1 refills | Status: DC

## 2017-09-22 MED ORDER — HYDROXYZINE HCL 25 MG PO TABS
25.0000 mg | ORAL_TABLET | Freq: Three times a day (TID) | ORAL | 0 refills | Status: DC | PRN
Start: 2017-09-22 — End: 2018-01-19

## 2017-09-22 MED ORDER — TRIAMCINOLONE ACETONIDE 0.5 % EX OINT: 1.0000 "application " | TOPICAL_OINTMENT | Freq: Two times a day (BID) | CUTANEOUS | 0 refills | Status: DC

## 2017-09-22 NOTE — Progress Notes (Signed)
Needs referral to Derm. For psoriasis

## 2017-09-22 NOTE — Patient Instructions (Signed)
Diabetes Mellitus and Nutrition When you have diabetes (diabetes mellitus), it is very important to have healthy eating habits because your blood sugar (glucose) levels are greatly affected by what you eat and drink. Eating healthy foods in the appropriate amounts, at about the same times every day, can help you:  Control your blood glucose.  Lower your risk of heart disease.  Improve your blood pressure.  Reach or maintain a healthy weight.  Every person with diabetes is different, and each person has different needs for a meal plan. Your health care provider may recommend that you work with a diet and nutrition specialist (dietitian) to make a meal plan that is best for you. Your meal plan may vary depending on factors such as:  The calories you need.  The medicines you take.  Your weight.  Your blood glucose, blood pressure, and cholesterol levels.  Your activity level.  Other health conditions you have, such as heart or kidney disease.  How do carbohydrates affect me? Carbohydrates affect your blood glucose level more than any other type of food. Eating carbohydrates naturally increases the amount of glucose in your blood. Carbohydrate counting is a method for keeping track of how many carbohydrates you eat. Counting carbohydrates is important to keep your blood glucose at a healthy level, especially if you use insulin or take certain oral diabetes medicines. It is important to know how many carbohydrates you can safely have in each meal. This is different for every person. Your dietitian can help you calculate how many carbohydrates you should have at each meal and for snack. Foods that contain carbohydrates include:  Bread, cereal, rice, pasta, and crackers.  Potatoes and corn.  Peas, beans, and lentils.  Milk and yogurt.  Fruit and juice.  Desserts, such as cakes, cookies, ice cream, and candy.  How does alcohol affect me? Alcohol can cause a sudden decrease in blood  glucose (hypoglycemia), especially if you use insulin or take certain oral diabetes medicines. Hypoglycemia can be a life-threatening condition. Symptoms of hypoglycemia (sleepiness, dizziness, and confusion) are similar to symptoms of having too much alcohol. If your health care provider says that alcohol is safe for you, follow these guidelines:  Limit alcohol intake to no more than 1 drink per day for nonpregnant women and 2 drinks per day for men. One drink equals 12 oz of beer, 5 oz of wine, or 1 oz of hard liquor.  Do not drink on an empty stomach.  Keep yourself hydrated with water, diet soda, or unsweetened iced tea.  Keep in mind that regular soda, juice, and other mixers may contain a lot of sugar and must be counted as carbohydrates.  What are tips for following this plan? Reading food labels  Start by checking the serving size on the label. The amount of calories, carbohydrates, fats, and other nutrients listed on the label are based on one serving of the food. Many foods contain more than one serving per package.  Check the total grams (g) of carbohydrates in one serving. You can calculate the number of servings of carbohydrates in one serving by dividing the total carbohydrates by 15. For example, if a food has 30 g of total carbohydrates, it would be equal to 2 servings of carbohydrates.  Check the number of grams (g) of saturated and trans fats in one serving. Choose foods that have low or no amount of these fats.  Check the number of milligrams (mg) of sodium in one serving. Most people   should limit total sodium intake to less than 2,300 mg per day.  Always check the nutrition information of foods labeled as "low-fat" or "nonfat". These foods may be higher in added sugar or refined carbohydrates and should be avoided.  Talk to your dietitian to identify your daily goals for nutrients listed on the label. Shopping  Avoid buying canned, premade, or processed foods. These  foods tend to be high in fat, sodium, and added sugar.  Shop around the outside edge of the grocery store. This includes fresh fruits and vegetables, bulk grains, fresh meats, and fresh dairy. Cooking  Use low-heat cooking methods, such as baking, instead of high-heat cooking methods like deep frying.  Cook using healthy oils, such as olive, canola, or sunflower oil.  Avoid cooking with butter, cream, or high-fat meats. Meal planning  Eat meals and snacks regularly, preferably at the same times every day. Avoid going long periods of time without eating.  Eat foods high in fiber, such as fresh fruits, vegetables, beans, and whole grains. Talk to your dietitian about how many servings of carbohydrates you can eat at each meal.  Eat 4-6 ounces of lean protein each day, such as lean meat, chicken, fish, eggs, or tofu. 1 ounce is equal to 1 ounce of meat, chicken, or fish, 1 egg, or 1/4 cup of tofu.  Eat some foods each day that contain healthy fats, such as avocado, nuts, seeds, and fish. Lifestyle   Check your blood glucose regularly.  Exercise at least 30 minutes 5 or more days each week, or as told by your health care provider.  Take medicines as told by your health care provider.  Do not use any products that contain nicotine or tobacco, such as cigarettes and e-cigarettes. If you need help quitting, ask your health care provider.  Work with a counselor or diabetes educator to identify strategies to manage stress and any emotional and social challenges. What are some questions to ask my health care provider?  Do I need to meet with a diabetes educator?  Do I need to meet with a dietitian?  What number can I call if I have questions?  When are the best times to check my blood glucose? Where to find more information:  American Diabetes Association: diabetes.org/food-and-fitness/food  Academy of Nutrition and Dietetics:  www.eatright.org/resources/health/diseases-and-conditions/diabetes  National Institute of Diabetes and Digestive and Kidney Diseases (NIH): www.niddk.nih.gov/health-information/diabetes/overview/diet-eating-physical-activity Summary  A healthy meal plan will help you control your blood glucose and maintain a healthy lifestyle.  Working with a diet and nutrition specialist (dietitian) can help you make a meal plan that is best for you.  Keep in mind that carbohydrates and alcohol have immediate effects on your blood glucose levels. It is important to count carbohydrates and to use alcohol carefully. This information is not intended to replace advice given to you by your health care provider. Make sure you discuss any questions you have with your health care provider. Document Released: 04/25/2005 Document Revised: 09/02/2016 Document Reviewed: 09/02/2016 Elsevier Interactive Patient Education  2018 Elsevier Inc.  

## 2017-09-22 NOTE — Telephone Encounter (Signed)
Called patient to inform her she needed to come back in for ordered lab test. She stated she will return one day this week. She states she didn't realized she  Was supposed to get lab work

## 2017-09-22 NOTE — Progress Notes (Signed)
Subjective:  Patient ID: Casey Bell, male    DOB: 1956-05-07  Age: 62 y.o. MRN: [id number]  CC: rash  HPI MARCOS EDWARDSON is a 62 y.o. male with a medical history of DM and psoriasis presents to establish care for DM and psoriasis. Reports feeling generally well. Has not had a PCP in a more than three years. Taking Metformin 500 mg BID. A1c 7.5% in clinic today. Has occasional tingling in the toes. Does not endorse polydipsia, polyuria, polyphagia, visual blurring, or fatigue. Denies CP, palpitations, SOB, HA, abdominal pain, or GI/GU sxs.    Reports itchy lesions on hands, arms, trunk, and legs since approximately 10 years ago. Feels lesions are now spreading and "becoming out of hand".  Previously took prednisone with relief of itching and rash.      Outpatient Medications Prior to Visit  Medication Sig Dispense Refill  . metFORMIN (GLUCOPHAGE) 500 MG tablet Take 500 mg by mouth 2 (two) times daily with a meal.    . cephALEXin (KEFLEX) 500 MG capsule Take 1 capsule (500 mg total) by mouth 4 (four) times daily. (Patient not taking: Reported on 09/22/2017) 20 capsule 0  . clindamycin (CLEOCIN) 150 MG capsule Take 3 capsules (450 mg total) by mouth 3 (three) times daily. (Patient not taking: Reported on 09/22/2017) 90 capsule 0  . hydrocortisone cream 1 % Apply to affected area 2 times daily 15 g 0  . metFORMIN (GLUCOPHAGE) 500 MG tablet Take 1 tablet (500 mg total) by mouth 2 (two) times daily. 60 tablet 0  . naproxen sodium (ANAPROX) 220 MG tablet Take 440 mg by mouth daily as needed (for pain).      No facility-administered medications prior to visit.      ROS Review of Systems  Constitutional: Negative for chills, fever and malaise/fatigue.  Eyes: Negative for blurred vision.  Respiratory: Negative for shortness of breath.   Cardiovascular: Negative for chest pain and palpitations.  Gastrointestinal: Negative for abdominal pain and nausea.  Genitourinary: Negative for dysuria  and hematuria.  Musculoskeletal: Negative for joint pain and myalgias.  Skin: Negative for rash.  Neurological: Negative for tingling and headaches.  Psychiatric/Behavioral: Negative for depression. The patient is not nervous/anxious.     Objective:  There were no vitals taken for this visit.  BP/Weight 08/23/2017 08/22/2017 07/05/2014  Systolic BP 165 - 138  Diastolic BP 92 - 77  Wt. (Lbs) - 205 -  BMI - 28.59 -      Physical Exam  Constitutional: He is oriented to person, place, and time.  Well developed, well nourished, NAD, polite  HENT:  Head: Normocephalic and atraumatic.  No oral thrush  Eyes: No scleral icterus.  Neck: Normal range of motion. Neck supple. No thyromegaly present.  Cardiovascular: Normal rate, regular rhythm and normal heart sounds.  Pulmonary/Chest: Effort normal and breath sounds normal.  Musculoskeletal: He exhibits no edema.  Neurological: He is alert and oriented to person, place, and time. No cranial nerve deficit. Coordination normal.  Skin: Skin is warm and dry. No rash noted. No erythema. No pallor.  Hyperpigmented and hypopigmented patches with overlying scale on upper extremities, legs, and back..   Psychiatric: He has a normal mood and affect. His behavior is normal. Thought content normal.  Vitals reviewed.    Assessment & Plan:   1. Type 2 diabetes mellitus without complication, without long-term current use of insulin (HCC) - Increase metFORMIN (GLUCOPHAGE) 1000 MG tablet; Take 1 tablet (1,000 mg total) by  mouth 2 (two) times daily with a meal.  Dispense: 180 tablet; Refill: 1 - HgB A1c 7.5% in clinic today - Glucose (CBG) 178 in clinic today - Microalbumin / creatinine urine ratio  2. Psoriasis - triamcinolone ointment (KENALOG) 0.5 %; Apply 1 application topically 2 (two) times daily.  Dispense: 30 g; Refill: 0 - hydrOXYzine (ATARAX/VISTARIL) 25 MG tablet; Take 1 tablet (25 mg total) by mouth 3 (three) times daily as needed.   Dispense: 30 tablet; Refill: 0  3. Screening for HIV (human immunodeficiency virus) - HIV antibody  4. Need for pneumococcal vaccination - Pneumococcal polysaccharide vaccine 23-valent greater than or equal to 2yo subcutaneous/IM  5. Need for prophylactic vaccination and inoculation against influenza - Flu Vaccine QUAD 6+ mos PF IM (Fluarix Quad PF)  6. High risk medication use - Hepatitis panel, acute  7. Special screening for malignant neoplasms, colon - Fecal occult blood, imunochemical   Meds ordered this encounter  Medications  . metFORMIN (GLUCOPHAGE) 1000 MG tablet    Sig: Take 1 tablet (1,000 mg total) by mouth 2 (two) times daily with a meal.    Dispense:  180 tablet    Refill:  1    Order Specific Question:   Supervising Provider    Answer:   Quentin Angst [id number]  . triamcinolone ointment (KENALOG) 0.5 %    Sig: Apply 1 application topically 2 (two) times daily.    Dispense:  30 g    Refill:  0    Order Specific Question:   Supervising Provider    Answer:   Quentin Angst [id number]  . hydrOXYzine (ATARAX/VISTARIL) 25 MG tablet    Sig: Take 1 tablet (25 mg total) by mouth 3 (three) times daily as needed.    Dispense:  30 tablet    Refill:  0    Order Specific Question:   Supervising Provider    Answer:   Quentin Angst [[id number]]    Follow-up: Return in about 3 months (around 12/20/2017) for DM2.   Loletta Specter PA

## 2017-09-23 LAB — HEPATITIS PANEL, ACUTE
HEP B C IGM: NEGATIVE
HEP B S AG: NEGATIVE
Hep A IgM: NEGATIVE
Hep C Virus Ab: <0.1 s/co ratio (ref 0.0–0.9)

## 2017-09-23 LAB — HIV ANTIBODY (ROUTINE TESTING W REFLEX): HIV SCREEN 4TH GENERATION: NONREACTIVE

## 2017-09-23 LAB — MICROALBUMIN / CREATININE URINE RATIO
Creatinine, Urine: 82.1 mg/dL
MICROALB/CREAT RATIO: 21.2 mg/g{creat} (ref 0.0–30.0)
Microalbumin, Urine: 17.4 ug/mL

## 2017-09-24 NOTE — Telephone Encounter (Signed)
Medical Assistant left message on patient's home and cell voicemail. Voicemail states to give a call back to [place] [facility] at [phone]. !!!Please inform patient of lab work being normal!!!

## 2017-09-24 NOTE — Telephone Encounter (Signed)
-----   Message from Loletta Specteroger David Gomez, PA-C sent at 09/23/2017  4:04 PM EST ----- Normal results.

## 2017-10-26 ENCOUNTER — Encounter (HOSPITAL_COMMUNITY): Payer: Self-pay | Admitting: Emergency Medicine

## 2017-10-26 ENCOUNTER — Emergency Department (HOSPITAL_COMMUNITY)
Admission: EM | Admit: 2017-10-26 | Discharge: 2017-10-27 | Disposition: A | Discharge status: Home / Self Care | Payer: Self-pay | Attending: Emergency Medicine | Admitting: Emergency Medicine

## 2017-10-26 DIAGNOSIS — Z79899 Other long term (current) drug therapy: Secondary | ICD-10-CM | POA: Insufficient documentation

## 2017-10-26 DIAGNOSIS — Z7984 Long term (current) use of oral hypoglycemic drugs: Secondary | ICD-10-CM | POA: Insufficient documentation

## 2017-10-26 DIAGNOSIS — R21 Rash and other nonspecific skin eruption: Secondary | ICD-10-CM | POA: Insufficient documentation

## 2017-10-26 DIAGNOSIS — E119 Type 2 diabetes mellitus without complications: Secondary | ICD-10-CM | POA: Insufficient documentation

## 2017-10-26 DIAGNOSIS — Z87891 Personal history of nicotine dependence: Secondary | ICD-10-CM | POA: Insufficient documentation

## 2017-10-26 LAB — CBG MONITORING, ED: GLUCOSE-CAPILLARY: 141 mg/dL — AB (ref 65–99)

## 2017-10-26 NOTE — ED Notes (Signed)
No answer in waiting room for triage 

## 2017-10-26 NOTE — ED Notes (Signed)
ED Provider at bedside. 

## 2017-10-26 NOTE — ED Triage Notes (Signed)
Patient presents to ED for assessment of generalized body rash x 2 weeks with itching, pain, bilateral.  Denies known exposure.  Hx of psoriasis, treated on 1/11.

## 2017-10-27 MED ORDER — PREDNISONE 10 MG PO TABS: ORAL_TABLET | ORAL | 0 refills | Status: DC

## 2017-10-27 MED ORDER — PREDNISONE 10 MG PO TABS: ORAL_TABLET | ORAL | 0 refills | Status: AC

## 2017-10-27 MED ORDER — PREDNISONE 20 MG PO TABS
40.0000 mg | ORAL_TABLET | Freq: Once | ORAL | Status: AC
  Administered 2017-10-27: 40 mg via ORAL
  Filled 2017-10-27: qty 2

## 2017-10-27 NOTE — Discharge Instructions (Addendum)
Please see the information and instructions below regarding your visit.  Your diagnoses today include:  1. Rash of entire body     Tests performed today include: See side panel of your discharge paperwork for testing performed today. Vital signs are listed at the bottom of these instructions.   Medications prescribed:    Take any prescribed medications only as prescribed, and any over the counter medications only as directed on the packaging.  Your prescribed a steroid taper.  Please make sure you are checking your blood sugar multiple times a day while taking this medicine to make sure it is not elevated.  Home care instructions:  Please follow any educational materials contained in this packet.   Follow-up instructions: Please follow-up with your primary care provider as soon as possible for further evaluation of your symptoms if they are not completely improved.   Return instructions:  Please return to the Emergency Department if you experience worsening symptoms.  Please return to the emergency department for any increasing redness, fever, chills, chest pain, shortness of breath, swelling or redness of the joints. Please return if you have any other emergent concerns.  Additional Information:   Your vital signs today were: BP (!) 161/117 (BP Location: Right Arm)    Pulse 68    Temp 98.7 F (37.1 C) (Oral)    Resp 15    SpO2 99%  If your blood pressure (BP) was elevated on multiple readings during this visit above 130 for the top number or above 80 for the bottom number, please have this repeated by your primary care provider within one month. --------------  Thank you for allowing us to participate in your care today.

## 2017-10-27 NOTE — ED Provider Notes (Signed)
[facility] EMERGENCY DEPARTMENT Provider Note   CSN: [id number] Arrival date & time: 10/26/17  1825     History   Chief Complaint Chief Complaint  Patient presents with  . Rash    HPI Casey Bell is a 62 y.o. male.  HPI  Patient is a 62 y.o male with a history of T2 DM presenting for diffuse scaly rash. Patient reports Casey Bell has had this rash on extensor surfaces, abdomen, and trunk for approximately 3 years, but has had a new outbreak within the last 2 weeks that is worse than prior.  Patient reports that Casey Bell presented approximately 2 months ago with a similar rash, but in a more limited distribution, and was given prednisone.  Patient reports Casey Bell had almost complete resolution of his lesions, but over the past 2 weeks they have returned and are more "burning" and pruritic in nature.  Patient reports Casey Bell is also had some more circular lesions on his forearms that Casey Bell has previously not had before.  Patient denies any fevers, chills, recent travel, erythematous, stiff, or warm joints.  Patient denies any chest pain, shortness breath, nausea, vomiting, or abdominal pain.  Patient reports that Casey Bell is now getting primary care, and his PCP is working on getting him a follow-up appointment to dermatology, however Casey Bell is unable to wait at this time due to the severity of this outbreak.  No topical or oral remedies tried for symptoms.  Past Medical History:  Diagnosis Date  . Diabetes mellitus without complication (HCC)   . Psoriasis .  [name] Psoriasis     There are no active problems to display for this patient.   History reviewed. No pertinent surgical history.     Home Medications    Prior to Admission medications   Medication Sig Start Date End Date Taking? Authorizing Provider  hydrOXYzine (ATARAX/VISTARIL) 25 MG tablet Take 1 tablet (25 mg total) by mouth 3 (three) times daily as needed. 09/22/17   Loletta Specter, PA-C  metFORMIN (GLUCOPHAGE) 1000 MG tablet Take  1 tablet (1,000 mg total) by mouth 2 (two) times daily with a meal. 09/22/17   Loletta Specter, PA-C  triamcinolone ointment (KENALOG) 0.5 % Apply 1 application topically 2 (two) times daily. 09/22/17   Loletta Specter, PA-C    Family History History reviewed. No pertinent family history.  Social History Social History   Tobacco Use  . Smoking status: Former Smoker    Last attempt to quit: 2010    Years since quitting: 9.2  . Smokeless tobacco: Never Used  Substance Use Topics  . Alcohol use: Yes    Comment: few/week  . Drug use: No     Allergies   Patient has no known allergies.   Review of Systems Review of Systems  Constitutional: Negative for chills and fever.  HENT: Negative for mouth sores.   Respiratory: Negative for shortness of breath and wheezing.   Cardiovascular: Negative for chest pain.  Gastrointestinal: Negative for abdominal pain, nausea and vomiting.  Musculoskeletal: Negative for arthralgias, joint swelling, neck pain and neck stiffness.  Skin: Positive for color change and rash.     Physical Exam Updated Vital Signs BP (!) 161/117 (BP Location: Right Arm)   Pulse 68   Temp 98.7 F (37.1 C) (Oral)   Resp 15   SpO2 99%   Physical Exam  Constitutional: Casey Bell appears well-developed and well-nourished. No distress.  Sitting comfortably in bed.  HENT:  Head: Normocephalic and atraumatic.  Eyes: Conjunctivae are normal. Right eye exhibits no discharge. Left eye exhibits no discharge.  EOMs normal to gross examination.  Neck: Normal range of motion.  Cardiovascular: Normal rate and regular rhythm.  Intact, 2+ radial pulse.  Pulmonary/Chest: Effort normal and breath sounds normal.  Normal respiratory effort. Patient converses comfortably. No audible wheeze or stridor.  Abdominal: Casey Bell exhibits no distension.  Musculoskeletal: Normal range of motion.  Neurological: Casey Bell is alert.  Cranial nerves intact to gross observation. Patient moves  extremities without difficulty.  Skin: Skin is warm and dry. Casey Bell is not diaphoretic.  See clinical photos for details.  There is diffuse, confluent scaly rash over the anterior and posterior thorax.  Similar rash on bilateral shins.  There is also rash on the palms and soles, that is confluent and scaly without purulence.  All lesions are on an erythematous base.  There are multiple annular lesions with overlying scaling on bilateral forearms, but predominantly on the left.  Psychiatric: Casey Bell has a normal mood and affect. His behavior is normal. Judgment and thought content normal.  Nursing note and vitals reviewed.        ED Treatments / Results  Labs (all labs ordered are listed, but only abnormal results are displayed) Labs Reviewed  CBG MONITORING, ED - Abnormal; Notable for the following components:      Result Value   Glucose-Capillary 141 (*)    All other components within normal limits    EKG  EKG Interpretation None       Radiology No results found.  Procedures Procedures (including critical care time)  Medications Ordered in ED Medications  predniSONE (DELTASONE) tablet 40 mg (not administered)     Initial Impression / Assessment and Plan / ED Course  I have reviewed the triage vital signs and the nursing notes.  Pertinent labs & imaging results that were available during my care of the patient were reviewed by me and considered in my medical decision making (see chart for details).     Patient is nontoxic-appearing, afebrile, and in no acute distress.  Rash is consistent with inflammatory lesions, and given prior response to prednisone, likely psoriatic in nature.  There is no evidence of a systemic infection at this time.  Given that patient is a diabetic, will reduce dose of steroids and do 1 week taper.  Patient to follow-up with primary care provider regarding recent dermatology referral.  Patient given return precautions for any fevers, chills, increasing  erythema, purulent lesions, or involvement of joints.  Patient is in understanding and agrees with the plan of care.  This is a supervised visit with Dr. Blane OharaJoshua Zavitz. Evaluation, management, and discharge planning discussed with this attending physician.   Final Clinical Impressions(s) / ED Diagnoses   Final diagnoses:  Rash of entire body      Delia ChimesMurray, Doyel Mulkern B, PA-C 10/27/17 0403    Blane OharaZavitz, Joshua, MD 10/28/17 [phone]

## 2017-10-29 ENCOUNTER — Other Ambulatory Visit (INDEPENDENT_AMBULATORY_CARE_PROVIDER_SITE_OTHER): Payer: Self-pay | Admitting: Physician Assistant

## 2017-10-29 DIAGNOSIS — L409 Psoriasis, unspecified: Secondary | ICD-10-CM

## 2017-12-23 ENCOUNTER — Ambulatory Visit (INDEPENDENT_AMBULATORY_CARE_PROVIDER_SITE_OTHER): Payer: Self-pay | Admitting: Physician Assistant

## 2017-12-23 ENCOUNTER — Ambulatory Visit (INDEPENDENT_AMBULATORY_CARE_PROVIDER_SITE_OTHER): Payer: Self-pay | Admitting: Nurse Practitioner

## 2018-01-19 ENCOUNTER — Encounter (INDEPENDENT_AMBULATORY_CARE_PROVIDER_SITE_OTHER): Payer: Self-pay | Admitting: Physician Assistant

## 2018-01-19 ENCOUNTER — Ambulatory Visit (INDEPENDENT_AMBULATORY_CARE_PROVIDER_SITE_OTHER): Payer: Self-pay | Admitting: Physician Assistant

## 2018-01-19 VITALS — BP 149/77 | HR 67 | Temp 98.3°F | Resp 18 | Ht 71.0 in | Wt 203.0 lb

## 2018-01-19 DIAGNOSIS — E119 Type 2 diabetes mellitus without complications: Secondary | ICD-10-CM

## 2018-01-19 DIAGNOSIS — R21 Rash and other nonspecific skin eruption: Secondary | ICD-10-CM

## 2018-01-19 LAB — POCT GLYCOSYLATED HEMOGLOBIN (HGB A1C): HBA1C, POC (CONTROLLED DIABETIC RANGE): 6.9 % (ref 0.0–7.0)

## 2018-01-19 LAB — GLUCOSE, POCT (MANUAL RESULT ENTRY): POC Glucose: 154 mg/dl — AB (ref 70–99)

## 2018-01-19 MED ORDER — HYDROXYZINE HCL 25 MG PO TABS: 25.0000 mg | ORAL_TABLET | Freq: Three times a day (TID) | ORAL | 2 refills | Status: DC | PRN

## 2018-01-19 MED ORDER — METFORMIN HCL 1000 MG PO TABS: 1000.0000 mg | ORAL_TABLET | Freq: Two times a day (BID) | ORAL | 1 refills | Status: DC

## 2018-01-19 MED ORDER — CEPHALEXIN 500 MG PO TABS: 500.0000 mg | ORAL_TABLET | Freq: Three times a day (TID) | ORAL | 0 refills | Status: DC

## 2018-01-19 NOTE — Progress Notes (Signed)
Subjective:  Patient ID: Casey Bell, male    DOB: July 26, 1956  Age: 62 y.o. MRN: [id number]  CC: f/u DM  HPI Casey Bell is a 62 y.o. male with a medical history of DM and psoriasis presents to establish care for DM. A1c 7.5% four months ago. A1c 6.9% in clinic today. Takes metformin 1000 mg BID as prescribed. Does not exercise. Eats lean meats, reduced fried food, and reduced breads. Still drinks sugary beverages. Does not endorse polydipsia, polyuria, polyphagia, visual blurring, or fatigue. Denies CP, palpitations, SOB, HA, abdominal pain, or GI/GU sxs.    Has a skin rash that spread to his upper extremities and abdomen. Intensely pruritic. Thought to have psoriasis. Triamcinolone was of little to no relief. Still working on obtaining CAFA.     Outpatient Medications Prior to Visit  Medication Sig Dispense Refill  . metFORMIN (GLUCOPHAGE) 1000 MG tablet Take 1 tablet (1,000 mg total) by mouth 2 (two) times daily with a meal. 180 tablet 1  . triamcinolone ointment (KENALOG) 0.5 % Apply 1 application topically 2 (two) times daily. 30 g 0  . hydrOXYzine (ATARAX/VISTARIL) 25 MG tablet Take 1 tablet (25 mg total) by mouth 3 (three) times daily as needed. (Patient not taking: Reported on 01/19/2018) 30 tablet 0   No facility-administered medications prior to visit.      ROS Review of Systems  Constitutional: Negative for chills, fever and malaise/fatigue.  Eyes: Negative for blurred vision.  Respiratory: Negative for shortness of breath.   Cardiovascular: Negative for chest pain and palpitations.  Gastrointestinal: Negative for abdominal pain and nausea.  Genitourinary: Negative for dysuria and hematuria.  Musculoskeletal: Negative for joint pain and myalgias.  Skin: Positive for itching and rash.  Neurological: Negative for tingling and headaches.  Psychiatric/Behavioral: Negative for depression. The patient is not nervous/anxious.     Objective:  BP (!) 149/77 (BP  Location: Left Arm, Patient Position: Sitting, Cuff Size: Large)   Pulse 67   Temp 98.3 F (36.8 C) (Oral)   Resp 18   Ht 5\' 11"  (1.803 m)   Wt 203 lb (92.1 kg)   SpO2 100%   BMI 28.31 kg/m   BP/Weight 01/19/2018 10/27/2017 09/22/2017  Systolic BP 149 155 128  Diastolic BP 77 99 79  Wt. (Lbs) 203 - 201  BMI 28.31 - 28.03      Physical Exam  Constitutional: He is oriented to person, place, and time.  Well developed, well nourished, NAD, polite  HENT:  Head: Normocephalic and atraumatic.  Eyes: No scleral icterus.  Neck: Normal range of motion. Neck supple. No thyromegaly present.  Cardiovascular: Normal rate, regular rhythm and normal heart sounds.  Pulmonary/Chest: Effort normal and breath sounds normal.  Abdominal: Soft. Bowel sounds are normal. There is no tenderness.  Musculoskeletal: He exhibits no edema.  Neurological: He is alert and oriented to person, place, and time.  Skin: Skin is warm and dry. No rash noted. No erythema. No pallor.  Diffuse area of crusting and post-inflammatory hyperpigmentation on upper extremities, lower extremities, and abdomen.  Psychiatric: He has a normal mood and affect. His behavior is normal. Thought content normal.  Vitals reviewed.    Assessment & Plan:   1. Type 2 diabetes mellitus without complication, without long-term current use of insulin (HCC) - Glucose (CBG) - HgB A1c 6.9% today. - Refill metFORMIN (GLUCOPHAGE) 1000 MG tablet; Take 1 tablet (1,000 mg total) by mouth 2 (two) times daily with a meal.  Dispense: 180  tablet; Refill: 1  2. Rash and nonspecific skin eruption - Awaiting CAFA decision before dermatology appt can be made.  - Ambulatory referral to Dermatology - Refill hydrOXYzine (ATARAX/VISTARIL) 25 MG tablet; Take 1 tablet (25 mg total) by mouth 3 (three) times daily as needed.  Dispense: 30 tablet; Refill: 2 - Prophylaxis for multiple excoriations on legs. Cephalexin 500 MG tablet; Take 1 tablet (500 mg total)  by mouth 3 (three) times daily for 10 days.  Dispense: 30 tablet; Refill: 0   Meds ordered this encounter  Medications  . hydrOXYzine (ATARAX/VISTARIL) 25 MG tablet    Sig: Take 1 tablet (25 mg total) by mouth 3 (three) times daily as needed.    Dispense:  30 tablet    Refill:  2    Order Specific Question:   Supervising Provider    Answer:   Hoy Register [[id number]]  . metFORMIN (GLUCOPHAGE) 1000 MG tablet    Sig: Take 1 tablet (1,000 mg total) by mouth 2 (two) times daily with a meal.    Dispense:  180 tablet    Refill:  1    Order Specific Question:   Supervising Provider    Answer:   Hoy Register [[id number]]  . Cephalexin 500 MG tablet    Sig: Take 1 tablet (500 mg total) by mouth 3 (three) times daily for 10 days.    Dispense:  30 tablet    Refill:  0    Order Specific Question:   Supervising Provider    Answer:   Hoy Register [[id number]]    Follow-up: Return in about 3 months (around 04/21/2018).   Loletta Specter PA

## 2018-01-19 NOTE — Patient Instructions (Signed)
Diabetes Mellitus and Nutrition When you have diabetes (diabetes mellitus), it is very important to have healthy eating habits because your blood sugar (glucose) levels are greatly affected by what you eat and drink. Eating healthy foods in the appropriate amounts, at about the same times every day, can help you:  Control your blood glucose.  Lower your risk of heart disease.  Improve your blood pressure.  Reach or maintain a healthy weight.  Every person with diabetes is different, and each person has different needs for a meal plan. Your health care provider may recommend that you work with a diet and nutrition specialist (dietitian) to make a meal plan that is best for you. Your meal plan may vary depending on factors such as:  The calories you need.  The medicines you take.  Your weight.  Your blood glucose, blood pressure, and cholesterol levels.  Your activity level.  Other health conditions you have, such as heart or kidney disease.  How do carbohydrates affect me? Carbohydrates affect your blood glucose level more than any other type of food. Eating carbohydrates naturally increases the amount of glucose in your blood. Carbohydrate counting is a method for keeping track of how many carbohydrates you eat. Counting carbohydrates is important to keep your blood glucose at a healthy level, especially if you use insulin or take certain oral diabetes medicines. It is important to know how many carbohydrates you can safely have in each meal. This is different for every person. Your dietitian can help you calculate how many carbohydrates you should have at each meal and for snack. Foods that contain carbohydrates include:  Bread, cereal, rice, pasta, and crackers.  Potatoes and corn.  Peas, beans, and lentils.  Milk and yogurt.  Fruit and juice.  Desserts, such as cakes, cookies, ice cream, and candy.  How does alcohol affect me? Alcohol can cause a sudden decrease in blood  glucose (hypoglycemia), especially if you use insulin or take certain oral diabetes medicines. Hypoglycemia can be a life-threatening condition. Symptoms of hypoglycemia (sleepiness, dizziness, and confusion) are similar to symptoms of having too much alcohol. If your health care provider says that alcohol is safe for you, follow these guidelines:  Limit alcohol intake to no more than 1 drink per day for nonpregnant women and 2 drinks per day for men. One drink equals 12 oz of beer, 5 oz of wine, or 1 oz of hard liquor.  Do not drink on an empty stomach.  Keep yourself hydrated with water, diet soda, or unsweetened iced tea.  Keep in mind that regular soda, juice, and other mixers may contain a lot of sugar and must be counted as carbohydrates.  What are tips for following this plan? Reading food labels  Start by checking the serving size on the label. The amount of calories, carbohydrates, fats, and other nutrients listed on the label are based on one serving of the food. Many foods contain more than one serving per package.  Check the total grams (g) of carbohydrates in one serving. You can calculate the number of servings of carbohydrates in one serving by dividing the total carbohydrates by 15. For example, if a food has 30 g of total carbohydrates, it would be equal to 2 servings of carbohydrates.  Check the number of grams (g) of saturated and trans fats in one serving. Choose foods that have low or no amount of these fats.  Check the number of milligrams (mg) of sodium in one serving. Most people   should limit total sodium intake to less than 2,300 mg per day.  Always check the nutrition information of foods labeled as "low-fat" or "nonfat". These foods may be higher in added sugar or refined carbohydrates and should be avoided.  Talk to your dietitian to identify your daily goals for nutrients listed on the label. Shopping  Avoid buying canned, premade, or processed foods. These  foods tend to be high in fat, sodium, and added sugar.  Shop around the outside edge of the grocery store. This includes fresh fruits and vegetables, bulk grains, fresh meats, and fresh dairy. Cooking  Use low-heat cooking methods, such as baking, instead of high-heat cooking methods like deep frying.  Cook using healthy oils, such as olive, canola, or sunflower oil.  Avoid cooking with butter, cream, or high-fat meats. Meal planning  Eat meals and snacks regularly, preferably at the same times every day. Avoid going long periods of time without eating.  Eat foods high in fiber, such as fresh fruits, vegetables, beans, and whole grains. Talk to your dietitian about how many servings of carbohydrates you can eat at each meal.  Eat 4-6 ounces of lean protein each day, such as lean meat, chicken, fish, eggs, or tofu. 1 ounce is equal to 1 ounce of meat, chicken, or fish, 1 egg, or 1/4 cup of tofu.  Eat some foods each day that contain healthy fats, such as avocado, nuts, seeds, and fish. Lifestyle   Check your blood glucose regularly.  Exercise at least 30 minutes 5 or more days each week, or as told by your health care provider.  Take medicines as told by your health care provider.  Do not use any products that contain nicotine or tobacco, such as cigarettes and e-cigarettes. If you need help quitting, ask your health care provider.  Work with a counselor or diabetes educator to identify strategies to manage stress and any emotional and social challenges. What are some questions to ask my health care provider?  Do I need to meet with a diabetes educator?  Do I need to meet with a dietitian?  What number can I call if I have questions?  When are the best times to check my blood glucose? Where to find more information:  American Diabetes Association: diabetes.org/food-and-fitness/food  Academy of Nutrition and Dietetics:  www.eatright.org/resources/health/diseases-and-conditions/diabetes  National Institute of Diabetes and Digestive and Kidney Diseases (NIH): www.niddk.nih.gov/health-information/diabetes/overview/diet-eating-physical-activity Summary  A healthy meal plan will help you control your blood glucose and maintain a healthy lifestyle.  Working with a diet and nutrition specialist (dietitian) can help you make a meal plan that is best for you.  Keep in mind that carbohydrates and alcohol have immediate effects on your blood glucose levels. It is important to count carbohydrates and to use alcohol carefully. This information is not intended to replace advice given to you by your health care provider. Make sure you discuss any questions you have with your health care provider. Document Released: 04/25/2005 Document Revised: 09/02/2016 Document Reviewed: 09/02/2016 Elsevier Interactive Patient Education  2018 Elsevier Inc.  

## 2018-01-26 ENCOUNTER — Other Ambulatory Visit (INDEPENDENT_AMBULATORY_CARE_PROVIDER_SITE_OTHER): Payer: Self-pay | Admitting: Physician Assistant

## 2018-01-26 ENCOUNTER — Telehealth (INDEPENDENT_AMBULATORY_CARE_PROVIDER_SITE_OTHER): Payer: Self-pay

## 2018-01-26 DIAGNOSIS — R21 Rash and other nonspecific skin eruption: Secondary | ICD-10-CM

## 2018-01-26 MED ORDER — CEPHALEXIN 500 MG PO TABS: 500.0000 mg | ORAL_TABLET | Freq: Three times a day (TID) | ORAL | 0 refills | Status: AC

## 2018-01-26 MED FILL — CEPHALEXIN 500 MG CAPSULE: 500 | 10 days supply | Qty: 30 | Fill #0

## 2018-01-26 NOTE — Telephone Encounter (Signed)
[facility]. [facility]. Maryjean Mornempestt S Ileane Sando, CMA

## 2018-01-26 NOTE — Telephone Encounter (Signed)
Patient aware. And provided with address and phone number to [facility] pharmacy. Casey Bell Casey Bell, CMA

## 2018-01-26 NOTE — Telephone Encounter (Signed)
Sent cephalexin to [facility] pharmacy.

## 2018-04-21 ENCOUNTER — Other Ambulatory Visit: Payer: Self-pay

## 2018-04-21 ENCOUNTER — Ambulatory Visit (INDEPENDENT_AMBULATORY_CARE_PROVIDER_SITE_OTHER): Payer: Self-pay | Admitting: Physician Assistant

## 2018-04-21 ENCOUNTER — Encounter (INDEPENDENT_AMBULATORY_CARE_PROVIDER_SITE_OTHER): Payer: Self-pay | Admitting: Physician Assistant

## 2018-04-21 VITALS — BP 136/82 | HR 67 | Temp 98.1°F | Ht 71.0 in | Wt 198.6 lb

## 2018-04-21 DIAGNOSIS — E119 Type 2 diabetes mellitus without complications: Secondary | ICD-10-CM

## 2018-04-21 DIAGNOSIS — Z23 Encounter for immunization: Secondary | ICD-10-CM

## 2018-04-21 DIAGNOSIS — Z79899 Other long term (current) drug therapy: Secondary | ICD-10-CM

## 2018-04-21 DIAGNOSIS — L409 Psoriasis, unspecified: Secondary | ICD-10-CM

## 2018-04-21 MED ORDER — LISINOPRIL 10 MG PO TABS: 10.0000 mg | ORAL_TABLET | Freq: Every day | ORAL | 3 refills | Status: DC

## 2018-04-21 MED ORDER — HYDROXYZINE HCL 25 MG PO TABS: 25.0000 mg | ORAL_TABLET | Freq: Three times a day (TID) | ORAL | 1 refills | Status: DC | PRN

## 2018-04-21 NOTE — Progress Notes (Signed)
Subjective:  Patient ID: Casey Bell, male    DOB: 1956/01/21  Age: 62 y.o. MRN: [id number]  CC: DM2 and psoriasis  HPI  Casey Andre Newkirkis a 62 y.o.malewith a medical history of DM and psoriasis presents for HTN and DM f/u. Last A1c 6.9% nearly three months ago. Taking Metformin as directed.. Reports blood sugar readings usually between 90 - 150.     Last BP 149/77 mmHg nearly three months ago. BP 136/82 mmHg today. Not prescribed any anti-hypertensives. Does not endorse CP, palpitations, SOB, HA, abdominal pain, f/c/n/v, edema, or GI/GU sxs.     Complains of itching of lesions throughout body. Has dry, itchy, plaques on elbows, legs, and torso. Says he is awaiting CAFA decision. Dermatology referral was closed due to lack of coverage/CAFA. Requests a refill of hydroxyzine due to the severe itching. Says pharmacy did not dispense that last refill.       Outpatient Medications Prior to Visit  Medication Sig Dispense Refill  . hydrOXYzine (ATARAX/VISTARIL) 25 MG tablet Take 1 tablet (25 mg total) by mouth 3 (three) times daily as needed. 30 tablet 2  . metFORMIN (GLUCOPHAGE) 1000 MG tablet Take 1 tablet (1,000 mg total) by mouth 2 (two) times daily with a meal. 180 tablet 1   No facility-administered medications prior to visit.      ROS Review of Systems  Constitutional: Negative for chills, fever and malaise/fatigue.  Eyes: Negative for blurred vision.  Respiratory: Negative for shortness of breath.   Cardiovascular: Negative for chest pain and palpitations.  Gastrointestinal: Negative for abdominal pain and nausea.  Genitourinary: Negative for dysuria and hematuria.  Musculoskeletal: Negative for joint pain and myalgias.  Skin: Negative for rash.  Neurological: Negative for tingling and headaches.  Psychiatric/Behavioral: Negative for depression. The patient is not nervous/anxious.     Objective:  There were no vitals taken for this visit.  BP/Weight 01/19/2018 10/27/2017  09/22/2017  Systolic BP 149 155 128  Diastolic BP 77 99 79  Wt. (Lbs) 203 - 201  BMI 28.31 - 28.03      Physical Exam  Constitutional: He is oriented to person, place, and time.  Well developed, well nourished, NAD, polite  HENT:  Head: Normocephalic and atraumatic.  Eyes: No scleral icterus.  Neck: Normal range of motion. Neck supple. No thyromegaly present.  Cardiovascular: Normal rate, regular rhythm and normal heart sounds.  Pulmonary/Chest: Effort normal and breath sounds normal.  Abdominal: Soft. Bowel sounds are normal. There is no tenderness.  Musculoskeletal: He exhibits no edema.  Neurological: He is alert and oriented to person, place, and time.  Skin:  Large plaques on elbows, legs, and torso with overlying fine, silvery, scale. Few excoriations on lower extremity lesions but no signs of cellulitis.   Psychiatric: He has a normal mood and affect. His behavior is normal. Thought content normal.  Vitals reviewed.    Assessment & Plan:    1. Type 2 diabetes mellitus without complication, without long-term current use of insulin (HCC) - Begin lisinopril (PRINIVIL,ZESTRIL) 10 MG tablet; Take 1 tablet (10 mg total) by mouth daily.  Dispense: 90 tablet; Refill: 3 - Comprehensive metabolic panel; Future - Lipid panel; Future  2. Psoriasis - Ambulatory referral to Dermatology. I have verified with Casey Bell that patient is 100% CAFA and will expire on 09/03/2018.  - Refill hydrOXYzine (ATARAX/VISTARIL) 25 MG tablet; Take 1 tablet (25 mg total) by mouth 3 (three) times daily as needed.  Dispense: 90 tablet; Refill:  1  3. High risk medication use - DG Chest 2 View; Future - PPD placed today. Advised to return between 48 - 72 hours. - Hepatitis panel, acute  4. Need for Tdap vaccination - Tdap vaccine greater than or equal to 7yo IM  5. Need for prophylactic vaccination and inoculation against influenza - Flu Vaccine QUAD 6+ mos PF IM (Fluarix Quad  PF)   Meds ordered this encounter  Medications  . lisinopril (PRINIVIL,ZESTRIL) 10 MG tablet    Sig: Take 1 tablet (10 mg total) by mouth daily.    Dispense:  90 tablet    Refill:  3    Order Specific Question:   Supervising Provider    Answer:   Hoy Register [[id number]]  . hydrOXYzine (ATARAX/VISTARIL) 25 MG tablet    Sig: Take 1 tablet (25 mg total) by mouth 3 (three) times daily as needed.    Dispense:  90 tablet    Refill:  1    Order Specific Question:   Supervising Provider    Answer:   Hoy Register [[id number]]    Follow-up:   Loletta Specter PA

## 2018-04-21 NOTE — Patient Instructions (Signed)
Psoriasis  Psoriasis is a long-term (chronic) skin condition. It causes raised, red patches (plaques) on your skin that look silvery. The red patches may show up anywhere on your body. They can be any size or shape.  Psoriasis can come and go. It can range from mild to very bad. It cannot be passed from one person to another (not contagious). There is no cure for this condition, but it can be helped with treatment.  Follow these instructions at home:  Skin Care   Apply moisturizers to your skin as needed. Only use those that your doctor has said are okay.   Apply cool compresses to the affected areas.   Do not scratch your skin.  Lifestyle     Do not use tobacco products. This includes cigarettes, chewing tobacco, and e-cigarettes. If you need help quitting, ask your doctor.   Drink little or no alcohol.   Try to lower your stress. Meditation or yoga may help.   Get sun as told by your doctor. Do not get sunburned.   Think about joining a psoriasis support group.  Medicines   Take or use over-the-counter and prescription medicines only as told by your doctor.   If you were prescribed an antibiotic, take or use it as told by your doctor. Do not stop taking the antibiotic even if your condition starts to get better.  General instructions   Keep a journal. Use this to help track what triggers an outbreak. Try to avoid any triggers.   See a counselor or social worker if you feel very sad, upset, or hopeless about your condition and these feelings affect your work or relationships.   Keep all follow-up visits as told by your doctor. This is important.  Contact a doctor if:   Your pain gets worse.   You have more redness or warmth in the affected areas.   You have new pain or stiffness in your joints.   Your pain or stiffness in your joints gets worse.   Your nails start to break easily.   Your nails pull away from the nail bed easily.   You have a fever.   You feel very sad (depressed).  This  information is not intended to replace advice given to you by your health care provider. Make sure you discuss any questions you have with your health care provider.  Document Released: 09/05/2004 Document Revised: 01/04/2016 Document Reviewed: 12/14/2014  Elsevier Interactive Patient Education  2018 Elsevier Inc.

## 2018-04-22 MED FILL — hydrOXYzine HCL 25 MG TABS: 25 | 30 days supply | Qty: 90 | Fill #0

## 2018-04-22 MED FILL — LISINOPRIL 10 MG TABS: 10 | 30 days supply | Qty: 30 | Fill #0

## 2018-04-24 ENCOUNTER — Ambulatory Visit (INDEPENDENT_AMBULATORY_CARE_PROVIDER_SITE_OTHER): Payer: Self-pay

## 2018-04-24 LAB — TB SKIN TEST
INDURATION: 0 mm
TB Skin Test: NEGATIVE

## 2018-04-27 LAB — FECAL OCCULT BLOOD, IMMUNOCHEMICAL: FECAL OCCULT BLD: NEGATIVE

## 2018-04-28 ENCOUNTER — Telehealth (INDEPENDENT_AMBULATORY_CARE_PROVIDER_SITE_OTHER): Payer: Self-pay

## 2018-04-28 NOTE — Telephone Encounter (Signed)
Patient is aware of negative FIT. Casey Bell, CMA  

## 2018-04-28 NOTE — Telephone Encounter (Signed)
-----   Message from Loletta Specteroger David Gomez, PA-C sent at 04/27/2018  5:47 PM EDT ----- Negative FIT

## 2019-10-14 ENCOUNTER — Ambulatory Visit: Payer: Self-pay | Attending: Internal Medicine

## 2019-10-14 DIAGNOSIS — Z23 Encounter for immunization: Secondary | ICD-10-CM | POA: Insufficient documentation

## 2019-10-14 NOTE — Progress Notes (Signed)
   Covid-19 Vaccination Clinic  Name:  CHRISTOPHERJAME CARNELL    MRN: [id number] DOB: 1956-02-13  10/14/2019  Mr. Kalman was observed post Covid-19 immunization for 15 minutes without incident. He was provided with Vaccine Information Sheet and instruction to access the V-Safe system.   Mr. Fessel was instructed to call 911 with any severe reactions post vaccine: [name] Difficulty breathing  . Swelling of face and throat  . A fast heartbeat  . A bad rash all over body  . Dizziness and weakness   Immunizations Administered    Name Date Dose VIS Date Route   Pfizer COVID-19 Vaccine 10/14/2019  3:52 PM 0.3 mL 07/23/2019 Intramuscular   Manufacturer: [employer], [employer]   Lot: [id number]   NDC: [address]-4

## 2019-11-10 ENCOUNTER — Ambulatory Visit: Payer: Self-pay | Attending: Internal Medicine

## 2019-11-10 DIAGNOSIS — Z23 Encounter for immunization: Secondary | ICD-10-CM

## 2019-11-10 NOTE — Progress Notes (Signed)
   Covid-19 Vaccination Clinic  Name:  KEOKI MCHARGUE    MRN: [id number] DOB: 03/24/1956  11/10/2019  Mr. Scaife was observed post Covid-19 immunization for 15 minutes without incident. He was provided with Vaccine Information Sheet and instruction to access the V-Safe system.   Mr. Pullman was instructed to call 911 with any severe reactions post vaccine: [name] Difficulty breathing  . Swelling of face and throat  . A fast heartbeat  . A bad rash all over body  . Dizziness and weakness   Immunizations Administered    Name Date Dose VIS Date Route   Pfizer COVID-19 Vaccine 11/10/2019  3:47 PM 0.3 mL 07/23/2019 Intramuscular   Manufacturer: [employer], [employer]   Lot: [id number]   NDC: [address]-7

## 2020-01-27 ENCOUNTER — Other Ambulatory Visit: Payer: Self-pay

## 2020-01-27 ENCOUNTER — Emergency Department (HOSPITAL_COMMUNITY)
Admission: EM | Admit: 2020-01-27 | Discharge: 2020-01-27 | Disposition: A | Discharge status: Home / Self Care | Payer: Self-pay | Attending: Emergency Medicine | Admitting: Emergency Medicine

## 2020-01-27 ENCOUNTER — Emergency Department (HOSPITAL_COMMUNITY): Payer: Self-pay

## 2020-01-27 ENCOUNTER — Encounter (HOSPITAL_COMMUNITY): Payer: Self-pay

## 2020-01-27 DIAGNOSIS — Z87891 Personal history of nicotine dependence: Secondary | ICD-10-CM | POA: Insufficient documentation

## 2020-01-27 DIAGNOSIS — Z7984 Long term (current) use of oral hypoglycemic drugs: Secondary | ICD-10-CM | POA: Insufficient documentation

## 2020-01-27 DIAGNOSIS — L409 Psoriasis, unspecified: Secondary | ICD-10-CM | POA: Insufficient documentation

## 2020-01-27 DIAGNOSIS — E119 Type 2 diabetes mellitus without complications: Secondary | ICD-10-CM | POA: Insufficient documentation

## 2020-01-27 DIAGNOSIS — Z23 Encounter for immunization: Secondary | ICD-10-CM | POA: Insufficient documentation

## 2020-01-27 LAB — CBC WITH DIFFERENTIAL/PLATELET
Abs Immature Granulocytes: 0.03 10*3/uL (ref 0.00–0.07)
Basophils Absolute: 0 10*3/uL (ref 0.0–0.1)
Basophils Relative: 1 %
Eosinophils Absolute: 0.3 10*3/uL (ref 0.0–0.5)
Eosinophils Relative: 5 %
HCT: 45.4 % (ref 39.0–52.0)
Hemoglobin: 14.6 g/dL (ref 13.0–17.0)
Immature Granulocytes: 1 %
Lymphocytes Relative: 27 %
Lymphs Abs: 1.7 10*3/uL (ref 0.7–4.0)
MCH: 29.3 pg (ref 26.0–34.0)
MCHC: 32.2 g/dL (ref 30.0–36.0)
MCV: 91.2 fL (ref 80.0–100.0)
Monocytes Absolute: 0.5 10*3/uL (ref 0.1–1.0)
Monocytes Relative: 7 %
Neutro Abs: 3.8 10*3/uL (ref 1.7–7.7)
Neutrophils Relative %: 59 %
Platelets: 269 10*3/uL (ref 150–400)
RBC: 4.98 MIL/uL (ref 4.22–5.81)
RDW: 14.2 % (ref 11.5–15.5)
WBC: 6.4 10*3/uL (ref 4.0–10.5)
nRBC: 0 % (ref 0.0–0.2)

## 2020-01-27 LAB — URINALYSIS, ROUTINE W REFLEX MICROSCOPIC
Bacteria, UA: NONE SEEN
Bilirubin Urine: NEGATIVE
Glucose, UA: NEGATIVE mg/dL
Hgb urine dipstick: NEGATIVE
Ketones, ur: 5 mg/dL — AB
Leukocytes,Ua: NEGATIVE
Nitrite: NEGATIVE
Protein, ur: 30 mg/dL — AB
Specific Gravity, Urine: 1.02 (ref 1.005–1.030)
pH: 5 (ref 5.0–8.0)

## 2020-01-27 LAB — COMPREHENSIVE METABOLIC PANEL
ALT: 24 U/L (ref 0–44)
AST: 42 U/L — ABNORMAL HIGH (ref 15–41)
Albumin: 3.4 g/dL — ABNORMAL LOW (ref 3.5–5.0)
Alkaline Phosphatase: 94 U/L (ref 38–126)
Anion gap: 15 (ref 5–15)
BUN: 11 mg/dL (ref 8–23)
CO2: 22 mmol/L (ref 22–32)
Calcium: 8.2 mg/dL — ABNORMAL LOW (ref 8.9–10.3)
Chloride: 105 mmol/L (ref 98–111)
Creatinine, Ser: 1.1 mg/dL (ref 0.61–1.24)
GFR calc Af Amer: >60 mL/min (ref >60)
GFR calc non Af Amer: >60 mL/min (ref >60)
Glucose, Bld: 126 mg/dL — ABNORMAL HIGH (ref 70–99)
Potassium: 4.1 mmol/L (ref 3.5–5.1)
Sodium: 142 mmol/L (ref 135–145)
Total Bilirubin: 0.6 mg/dL (ref 0.3–1.2)
Total Protein: 7.5 g/dL (ref 6.5–8.1)

## 2020-01-27 LAB — RAPID URINE DRUG SCREEN, HOSP PERFORMED
Amphetamines: NOT DETECTED
Barbiturates: NOT DETECTED
Benzodiazepines: NOT DETECTED
Cocaine: NOT DETECTED
Opiates: NOT DETECTED
Tetrahydrocannabinol: POSITIVE — AB

## 2020-01-27 LAB — CBG MONITORING, ED: Glucose-Capillary: 107 mg/dL — ABNORMAL HIGH (ref 70–99)

## 2020-01-27 MED ORDER — METHYLPREDNISOLONE SODIUM SUCC 125 MG IJ SOLR
125.0000 mg | Freq: Once | INTRAMUSCULAR | Status: AC
  Administered 2020-01-27: 125 mg via INTRAVENOUS
  Filled 2020-01-27: qty 2

## 2020-01-27 MED ORDER — LUBRIDERM EX LOTN: 15.0000 mL | TOPICAL_LOTION | Freq: Two times a day (BID) | CUTANEOUS | 0 refills | Status: AC

## 2020-01-27 MED ORDER — HYDROXYZINE HCL 25 MG PO TABS
25.0000 mg | ORAL_TABLET | Freq: Once | ORAL | Status: AC
  Administered 2020-01-27: 25 mg via ORAL
  Filled 2020-01-27: qty 1

## 2020-01-27 MED ORDER — HYDROXYZINE HCL 25 MG PO TABS
25.0000 mg | ORAL_TABLET | Freq: Four times a day (QID) | ORAL | 0 refills | Status: DC | PRN
Start: 2020-01-27 — End: 2020-03-23

## 2020-01-27 MED ORDER — SODIUM CHLORIDE 0.9 % IV BOLUS
1000.0000 mL | Freq: Once | INTRAVENOUS | Status: AC
  Administered 2020-01-27: 1000 mL via INTRAVENOUS

## 2020-01-27 MED ORDER — SORBITOL 70 % SOLN
960.0000 mL | TOPICAL_OIL | Freq: Once | ORAL | Status: DC
  Filled 2020-01-27: qty 473

## 2020-01-27 MED ORDER — TRIAMCINOLONE ACETONIDE 0.1 % EX CREA: 1.0000 "application " | TOPICAL_CREAM | Freq: Two times a day (BID) | CUTANEOUS | 0 refills | Status: AC

## 2020-01-27 MED ORDER — METOPROLOL TARTRATE 5 MG/5ML IV SOLN
5.0000 mg | Freq: Once | INTRAVENOUS | Status: AC
  Administered 2020-01-27: 5 mg via INTRAVENOUS
  Filled 2020-01-27: qty 5

## 2020-01-27 NOTE — ED Triage Notes (Signed)
Pt states he is here for his psoriasis outbreak that is all over his body. Pt states he is having pain all over his body. Pt HR 170 in triage and pt is in A Fib. Pt currently in room 21 with Dr. Effie Shy. Pt denies chest pain and denies SOB. Pt denies feeling like his pulse is racing.

## 2020-01-27 NOTE — ED Notes (Signed)
An After Visit Summary was printed and given to the patient. Discharge instructions given and no further questions at this time.  

## 2020-01-27 NOTE — ED Notes (Signed)
Pt able to tolerate PO fluids as ordered.  

## 2020-01-27 NOTE — ED Provider Notes (Signed)
[facility]-EMERGENCY DEPT Provider Note   CSN: [id number] Arrival date & time: 01/27/20  1513     History Chief Complaint  Patient presents with  . Psoriasis    Casey Bell is a 64 y.o. male.  HPI He presents for evaluation of generalized itching, from psoriasis.  He has not been able to see his PCP or take medications for psoriasis in more than 1 year because of the pandemic.  He is not sure what medicines he is taking in the past for psoriasis.  He has been using Aveeno, without relief of his discomfort.  He denies fever, chills, cough, shortness of breath, weakness or dizziness.    Past Medical History:  Diagnosis Date  . Diabetes mellitus without complication (HCC)   . Psoriasis .  [name] Psoriasis     There are no problems to display for this patient.   History reviewed. No pertinent surgical history.     History reviewed. No pertinent family history.  Social History   Tobacco Use  . Smoking status: Former Smoker    Quit date: 2010    Years since quitting: 11.4  . Smokeless tobacco: Never Used  Substance Use Topics  . Alcohol use: Yes    Comment: few/week  . Drug use: Yes    Types: Marijuana    Home Medications Prior to Admission medications   Medication Sig Start Date End Date Taking? Authorizing Provider  acetaminophen (TYLENOL) 500 MG tablet Take 500 mg by mouth every 6 (six) hours as needed for moderate pain.   Yes [provider]  metFORMIN (GLUCOPHAGE) 1000 MG tablet Take 1 tablet (1,000 mg total) by mouth 2 (two) times daily with a meal. Patient taking differently: Take 500 mg by mouth 2 (two) times daily with a meal.  01/19/18  Yes Loletta Specter, PA-C  Emollient [place]) LOTN Apply 15 application topically in the morning and at bedtime. 01/27/20   Mancel Bale, MD  hydrOXYzine (ATARAX/VISTARIL) 25 MG tablet Take 1 tablet (25 mg total) by mouth every 6 (six) hours as needed for itching. 01/27/20   Mancel Bale,  MD  lisinopril (PRINIVIL,ZESTRIL) 10 MG tablet Take 1 tablet (10 mg total) by mouth daily. Patient not taking: Reported on 01/27/2020 04/21/18   Loletta Specter, PA-C  triamcinolone cream (KENALOG) 0.1 % Apply 1 application topically 2 (two) times daily. Use 5 to 10 cc, with each application, from the neck to the toes.  Apply this with Lubriderm to extend the treatment to a bigger part of your body. 01/27/20   Mancel Bale, MD    Allergies    Patient has no known allergies.  Review of Systems   Review of Systems  All other systems reviewed and are negative.   Physical Exam Updated Vital Signs BP (!) 169/98   Pulse 95   Temp 98.3 F (36.8 C)   Resp (!) 24   SpO2 98%   Physical Exam Vitals and nursing note reviewed.  Constitutional:      Appearance: He is well-developed.  HENT:     Head: Normocephalic and atraumatic.     Right Ear: External ear normal.     Left Ear: External ear normal.  Eyes:     Conjunctiva/sclera: Conjunctivae normal.     Pupils: Pupils are equal, round, and reactive to light.  Neck:     Trachea: Phonation normal.  Cardiovascular:     Rate and Rhythm: Normal rate.  Pulmonary:     Effort:  Pulmonary effort is normal.  Abdominal:     General: There is no distension.  Musculoskeletal:        General: Normal range of motion.     Cervical back: Normal range of motion and neck supple.  Skin:    General: Skin is warm and dry.     Comments: Generalized plaques, neck, torso, arms and legs, consistent with psoriasis.  Mild erythema without drainage.  Neurological:     Mental Status: He is alert and oriented to person, place, and time.     Cranial Nerves: No cranial nerve deficit.     Sensory: No sensory deficit.     Motor: No abnormal muscle tone.     Coordination: Coordination normal.  Psychiatric:        Mood and Affect: Mood normal.        Behavior: Behavior normal.        Thought Content: Thought content normal.        Judgment: Judgment normal.       ED Results / Procedures / Treatments   Labs (all labs ordered are listed, but only abnormal results are displayed) Labs Reviewed  COMPREHENSIVE METABOLIC PANEL - Abnormal; Notable for the following components:      Result Value   Glucose, Bld 126 (*)    Calcium 8.2 (*)    Albumin 3.4 (*)    AST 42 (*)    All other components within normal limits  URINALYSIS, ROUTINE W REFLEX MICROSCOPIC - Abnormal; Notable for the following components:   Ketones, ur 5 (*)    Protein, ur 30 (*)    All other components within normal limits  RAPID URINE DRUG SCREEN, HOSP PERFORMED - Abnormal; Notable for the following components:   Tetrahydrocannabinol POSITIVE (*)    All other components within normal limits  CBG MONITORING, ED - Abnormal; Notable for the following components:   Glucose-Capillary 107 (*)    All other components within normal limits  CBC WITH DIFFERENTIAL/PLATELET    EKG EKG Interpretation  Date/Time:  Thursday January 27 2020 15:36:53 EDT Ventricular Rate:  127 PR Interval:    QRS Duration: 80 QT Interval:  300 QTC Calculation: 436 R Axis:   153 Text Interpretation: Sinus tachycardia with Premature atrial complexes Left posterior fascicular block Nonspecific ST abnormality Abnormal ECG No old tracing to compare Confirmed by Mancel Bale [phone]) on 01/27/2020 3:50:37 PM   Radiology DG Chest Port 1 View  Result Date: 01/27/2020 CLINICAL DATA:  Cardiac palpitations EXAM: PORTABLE CHEST 1 VIEW COMPARISON:  None. FINDINGS: Lungs are clear. Heart is borderline enlarged with pulmonary vascularity normal. No adenopathy. There is aortic atherosclerosis. No bone lesions. IMPRESSION: Lungs clear. Heart borderline enlarged. No adenopathy. Aortic Atherosclerosis (ICD10-I70.0). Electronically Signed   By: Bretta Bang III M.D.   On: 01/27/2020 16:36    Procedures Procedures (including critical care time)  Medications Ordered in ED Medications  sodium chloride 0.9 % bolus  1,000 mL (0 mLs Intravenous Stopped 01/27/20 1834)  metoprolol tartrate (LOPRESSOR) injection 5 mg (5 mg Intravenous Given 01/27/20 1625)  methylPREDNISolone sodium succinate (SOLU-MEDROL) 125 mg/2 mL injection 125 mg (125 mg Intravenous Given 01/27/20 1625)  hydrOXYzine (ATARAX/VISTARIL) tablet 25 mg (25 mg Oral Given 01/27/20 1625)    ED Course  I have reviewed the triage vital signs and the nursing notes.  Pertinent labs & imaging results that were available during my care of the patient were reviewed by me and considered in my medical decision making (see  chart for details).  Clinical Course as of Jan 26 2102  Thu Jan 27, 2020  2008 Normal except presence of [place]  Urine rapid drug screen (hosp performed)(!) [EW]  2008 Normal except glucose high, calcium low, albumin low, AST high  Comprehensive metabolic panel(!) [EW]  2008 Normal  Urinalysis, Routine w reflex microscopic(!) [EW]  2008 Normal   [EW]  2009 No infiltrate or edema, interpreted by me  DG Chest [facility] 1 View [EW]    Clinical Course User Index [EW] Mancel Bale, MD   MDM Rules/Calculators/A&P                           Patient Vitals for the past 24 hrs:  BP Temp Temp src Pulse Resp SpO2  01/27/20 2030 (!) 169/98 -- -- 95 (!) 24 98 %  01/27/20 2006 (!) 158/99 -- -- 70 (!) 23 95 %  01/27/20 1901 (!) 164/94 -- -- 96 (!) 23 95 %  01/27/20 1830 (!) 144/118 -- -- (!) 48 (!) 23 98 %  01/27/20 1746 (!) 155/94 98.3 F (36.8 C) -- 90 (!) 22 98 %  01/27/20 1700 (!) 144/89 -- -- 83 (!) 24 100 %  01/27/20 1630 129/80 -- -- 98 15 100 %  01/27/20 1527 116/62 98.6 F (37 C) Oral (!) 130 18 100 %    9:00 PM Reevaluation with update and discussion. After initial assessment and treatment, an updated evaluation reveals states itching is improved after treatment. Mancel Bale   Medical Decision Making:  This patient is presenting for evaluation of itching, which does require a range of treatment options, and is a complaint that  involves a moderate risk of morbidity and mortality. The differential diagnoses include rash, psoriasis, drug reaction. I decided to review old records, and in summary healthy middle-aged man with history of psoriasis, not currently being treated..  I did not require additional historical information from anyone.  Clinical Laboratory Tests Ordered, included CBC, Metabolic panel, Urinalysis and Drug screen. Review indicates tests are essentially normal with very mild elevation of AST and low albumin at 3.4..   Critical Interventions-clinical evaluation, laboratory testing, medication treatment, observation and reevaluation  After These Interventions, the Patient was reevaluated and was found improved and stable for discharge  CRITICAL CARE-no Performed by: Mancel Bale  Nursing Notes Reviewed/ Care Coordinated Applicable Imaging Reviewed Interpretation of Laboratory Data incorporated into ED treatment  The patient appears reasonably screened and/or stabilized for discharge and I doubt any other medical condition or other [facility] requiring further screening, evaluation, or treatment in the ED at this time prior to discharge.  Plan: Home Medications-continue any usual medications; Home Treatments-usual treatment; return here if the recommended treatment, does not improve the symptoms; Recommended follow up-PCP follow-up as soon as possible.     Final Clinical Impression(s) / ED Diagnoses Final diagnoses:  Psoriasis    Rx / DC Orders ED Discharge Orders         Ordered    Emollient (LUBRIDERM) LOTN  2 times daily     Discontinue  Reprint     01/27/20 2017    triamcinolone cream (KENALOG) 0.1 %  2 times daily     Discontinue  Reprint     01/27/20 2017    hydrOXYzine (ATARAX/VISTARIL) 25 MG tablet  Every 6 hours PRN     Discontinue  Reprint     01/27/20 2100  Daleen Bo, MD 01/27/20 2103

## 2020-01-27 NOTE — Discharge Instructions (Addendum)
We are prescribing medication to help your psoriasis.  See your doctor for a checkup soon as possible.

## 2020-03-23 ENCOUNTER — Other Ambulatory Visit: Payer: Self-pay

## 2020-03-23 ENCOUNTER — Encounter (INDEPENDENT_AMBULATORY_CARE_PROVIDER_SITE_OTHER): Payer: Self-pay | Admitting: Primary Care

## 2020-03-23 ENCOUNTER — Ambulatory Visit (INDEPENDENT_AMBULATORY_CARE_PROVIDER_SITE_OTHER): Payer: Self-pay | Admitting: Primary Care

## 2020-03-23 VITALS — BP 126/87 | HR 97 | Temp 98.9°F | Resp 16 | Ht 71.0 in | Wt 200.0 lb

## 2020-03-23 DIAGNOSIS — E119 Type 2 diabetes mellitus without complications: Secondary | ICD-10-CM

## 2020-03-23 DIAGNOSIS — L299 Pruritus, unspecified: Secondary | ICD-10-CM

## 2020-03-23 DIAGNOSIS — L409 Psoriasis, unspecified: Secondary | ICD-10-CM

## 2020-03-23 LAB — GLUCOSE, POCT (MANUAL RESULT ENTRY): POC Glucose: 118 mg/dL — AB (ref 70–99)

## 2020-03-23 LAB — POCT GLYCOSYLATED HEMOGLOBIN (HGB A1C): Hemoglobin A1C: 5.7 % — AB (ref 4.0–5.6)

## 2020-03-23 MED ORDER — HYDROXYZINE HCL 10 MG PO TABS
10.0000 mg | ORAL_TABLET | Freq: Three times a day (TID) | ORAL | 0 refills | Status: DC | PRN
Start: 2020-03-23 — End: 2020-07-27

## 2020-03-23 MED ORDER — CLOBETASOL PROPIONATE 0.05 % EX SHAM: 0.5000 mL | MEDICATED_SHAMPOO | Freq: Two times a day (BID) | CUTANEOUS | 2 refills | Status: DC

## 2020-03-23 MED ORDER — CLOBETASOL PROPIONATE 0.05 % EX LOTN: 118.0000 mL | TOPICAL_LOTION | Freq: Two times a day (BID) | CUTANEOUS | 1 refills | Status: DC

## 2020-03-23 NOTE — Progress Notes (Signed)
DM check up

## 2020-03-23 NOTE — Progress Notes (Signed)
New Patient Office Visit  Subjective:  Patient ID: Casey Bell, male    DOB: 14-Sep-1955  Age: 64 y.o. MRN: [id number]  CC: No chief complaint on file.   HPI Casey Bell presents 64 year old male in today to re-establish care . His main concern is for  itching, from psoriasis. He has no insurance and will renew [facility] financial assistance.   Past Medical History:  Diagnosis Date  . Diabetes mellitus without complication (HCC)   . Psoriasis .  [name] Psoriasis     No past surgical history on file.  Family History  Problem Relation Age of Onset  . Chronic Renal Failure Mother   . Heart failure Mother     Social History   Socioeconomic History  . Marital status: Divorced    Spouse name: Not on file  . Number of children: Not on file  . Years of education: Not on file  . Highest education level: Not on file  Occupational History  . Not on file  Tobacco Use  . Smoking status: Former Smoker    Quit date: 2010    Years since quitting: 11.6  . Smokeless tobacco: Never Used  Substance and Sexual Activity  . Alcohol use: Yes    Comment: few/week  . Drug use: Yes    Types: Marijuana  . Sexual activity: Not Currently  Other Topics Concern  . Not on file  Social History Narrative  . Not on file   Social Determinants of Health   Financial Resource Strain:   . Difficulty of Paying Living Expenses:   Food Insecurity:   . Worried About Programme researcher, broadcasting/film/video in the Last Year:   . Barista in the Last Year:   Transportation Needs:   . Freight forwarder (Medical):   [name] Lack of Transportation (Non-Medical):   Physical Activity:   . Days of Exercise per Week:   . Minutes of Exercise per Session:   Stress:   . Feeling of Stress :   Social Connections:   . Frequency of Communication with Friends and Family:   . Frequency of Social Gatherings with Friends and Family:   . Attends Religious Services:   . Active Member of Clubs or Organizations:   . Attends  Banker Meetings:   [name] Marital Status:   Intimate Partner Violence:   . Fear of Current or Ex-Partner:   . Emotionally Abused:   [name] Physically Abused:   . Sexually Abused:     ROS Review of Systems  Objective:   Today's Vitals: BP 126/87   Pulse 97   Temp 98.9 F (37.2 C)   Resp 16   Ht 5\' 11"  (1.803 m)   Wt 200 lb (90.7 kg)   SpO2 97%   BMI 27.89 kg/m   Physical Exam Vitals reviewed.  Constitutional:      Appearance: Normal appearance.  HENT:     Nose: Nose normal.  Cardiovascular:     Rate and Rhythm: Normal rate.     Pulses: Normal pulses.     Heart sounds: Normal heart sounds.  Pulmonary:     Effort: Pulmonary effort is normal.     Breath sounds: Normal breath sounds.  Abdominal:     General: Bowel sounds are normal.  Musculoskeletal:     Cervical back: Normal range of motion and neck supple.  Skin:    General: Skin is warm and dry.  Neurological:  Mental Status: He is alert and oriented to person, place, and time.  Psychiatric:        Mood and Affect: Mood normal.        Behavior: Behavior normal.        Thought Content: Thought content normal.        Judgment: Judgment normal.     Assessment & Plan:     No facility-administered encounter medications on file as of 03/23/2020.   Outpatient Encounter Medications as of 03/23/2020  Medication Sig  . acetaminophen (TYLENOL) 500 MG tablet Take 500 mg by mouth every 6 (six) hours as needed for moderate pain.  . Clobetasol Propionate 0.05 % lotion Apply 118 mLs topically 2 (two) times daily.  . Clobetasol Propionate 0.05 % shampoo Apply 0.5 mLs topically 2 (two) times daily.  . Emollient (LUBRIDERM) LOTN Apply 15 application topically in the morning and at bedtime.  . hydrOXYzine (ATARAX/VISTARIL) 10 MG tablet Take 1 tablet (10 mg total) by mouth 3 (three) times daily as needed. (Patient taking differently: Take 10 mg by mouth 3 (three) times daily as needed for itching. )  . lisinopril  (PRINIVIL,ZESTRIL) 10 MG tablet Take 1 tablet (10 mg total) by mouth daily. (Patient not taking: Reported on 01/27/2020)  . metFORMIN (GLUCOPHAGE) 1000 MG tablet Take 1 tablet (1,000 mg total) by mouth 2 (two) times daily with a meal. (Patient taking differently: Take 500 mg by mouth 2 (two) times daily with a meal. )  . triamcinolone cream (KENALOG) 0.1 % Apply 1 application topically 2 (two) times daily. Use 5 to 10 cc, with each application, from the neck to the toes.  Apply this with Lubriderm to extend the treatment to a bigger part of your body.  . [DISCONTINUED] hydrOXYzine (ATARAX/VISTARIL) 25 MG tablet Take 1 tablet (25 mg total) by mouth every 6 (six) hours as needed for itching.  Diagnoses and all orders for this visit:  Type 2 diabetes mellitus without complication, without long-term current use of insulin (HCC) -     HgB A1c 5.7 prediabetes A1c 5.7-6.4 currently on Metformin 500 mg twice daily.  -     POCT glucose (manual entry)  Psoriasis      Needs Referral to dermatology no insurance at this time. Encouraged to apply for [facility] assistance. Clobetasol Propionate 0.05 % lotion; Apply 118 mLs topically 2 (two) times daily. -     Clobetasol Propionate 0.05 % shampoo; Apply 0.5 mLs topically 2 (two) times daily.  Pruritus Secondary psoriasis eczema use emollient will prescribe atarax 10mg  prn  Other orders -     Clobetasol Propionate 0.05 % lotion; Apply 118 mLs topically 2 (two) times daily. -     Clobetasol Propionate 0.05 % shampoo; Apply 0.5 mLs topically 2 (two) times daily. -     hydrOXYzine (ATARAX/VISTARIL) 10 MG tablet; Take 1 tablet (10 mg total) by mouth 3 (three) times daily as needed. (Patient taking differently: Take 10 mg by mouth 3 (three) times daily as needed for itching. )  Consulted with wound care and clinical pharmacy only medication will be available clobetasol shampoo and lotion  Follow-up: No follow-ups on file.   , NP

## 2020-03-24 MED FILL — hydrOXYzine HCL 10 MG TABS: 10 | 30 days supply | Qty: 90 | Fill #0

## 2020-03-24 MED FILL — CLOBETASOL PROPIONATE 0.05: 0.05 | 14 days supply | Qty: 118 | Fill #0

## 2020-03-24 MED FILL — CLOBETASOL 0.05% TOPICAL LO: 0.05 | 7 days supply | Qty: 59 | Fill #0

## 2020-03-26 ENCOUNTER — Encounter (HOSPITAL_COMMUNITY): Payer: Self-pay | Admitting: Internal Medicine

## 2020-03-26 ENCOUNTER — Inpatient Hospital Stay (HOSPITAL_COMMUNITY)
Admission: EM | Admit: 2020-03-26 | Discharge: 2020-03-28 | DRG: 603 | Disposition: A | Discharge status: Home / Self Care | Payer: Self-pay | Attending: Family Medicine | Admitting: Family Medicine

## 2020-03-26 ENCOUNTER — Other Ambulatory Visit: Payer: Self-pay

## 2020-03-26 DIAGNOSIS — I10 Essential (primary) hypertension: Secondary | ICD-10-CM | POA: Diagnosis present

## 2020-03-26 DIAGNOSIS — Z87891 Personal history of nicotine dependence: Secondary | ICD-10-CM

## 2020-03-26 DIAGNOSIS — L409 Psoriasis, unspecified: Secondary | ICD-10-CM | POA: Diagnosis present

## 2020-03-26 DIAGNOSIS — Z20822 Contact with and (suspected) exposure to covid-19: Secondary | ICD-10-CM | POA: Diagnosis present

## 2020-03-26 DIAGNOSIS — Z841 Family history of disorders of kidney and ureter: Secondary | ICD-10-CM

## 2020-03-26 DIAGNOSIS — E119 Type 2 diabetes mellitus without complications: Secondary | ICD-10-CM | POA: Diagnosis present

## 2020-03-26 DIAGNOSIS — Z8249 Family history of ischemic heart disease and other diseases of the circulatory system: Secondary | ICD-10-CM

## 2020-03-26 DIAGNOSIS — R7401 Elevation of levels of liver transaminase levels: Secondary | ICD-10-CM | POA: Diagnosis present

## 2020-03-26 DIAGNOSIS — I4891 Unspecified atrial fibrillation: Secondary | ICD-10-CM | POA: Diagnosis present

## 2020-03-26 DIAGNOSIS — I4892 Unspecified atrial flutter: Secondary | ICD-10-CM

## 2020-03-26 DIAGNOSIS — R7989 Other specified abnormal findings of blood chemistry: Secondary | ICD-10-CM | POA: Diagnosis present

## 2020-03-26 DIAGNOSIS — Z79899 Other long term (current) drug therapy: Secondary | ICD-10-CM

## 2020-03-26 DIAGNOSIS — L03116 Cellulitis of left lower limb: Principal | ICD-10-CM

## 2020-03-26 DIAGNOSIS — Z7984 Long term (current) use of oral hypoglycemic drugs: Secondary | ICD-10-CM

## 2020-03-26 DIAGNOSIS — R748 Abnormal levels of other serum enzymes: Secondary | ICD-10-CM | POA: Diagnosis present

## 2020-03-26 LAB — URINALYSIS, ROUTINE W REFLEX MICROSCOPIC
Bacteria, UA: NONE SEEN
Bilirubin Urine: NEGATIVE
Glucose, UA: NEGATIVE mg/dL
Hgb urine dipstick: NEGATIVE
Ketones, ur: NEGATIVE mg/dL
Leukocytes,Ua: NEGATIVE
Nitrite: NEGATIVE
Protein, ur: 30 mg/dL — AB
Specific Gravity, Urine: 1.02 (ref 1.005–1.030)
pH: 5 (ref 5.0–8.0)

## 2020-03-26 LAB — COMPREHENSIVE METABOLIC PANEL
ALT: 26 U/L (ref 0–44)
AST: 46 U/L — ABNORMAL HIGH (ref 15–41)
Albumin: 3.3 g/dL — ABNORMAL LOW (ref 3.5–5.0)
Alkaline Phosphatase: 134 U/L — ABNORMAL HIGH (ref 38–126)
Anion gap: 10 (ref 5–15)
BUN: 12 mg/dL (ref 8–23)
CO2: 21 mmol/L — ABNORMAL LOW (ref 22–32)
Calcium: 8.6 mg/dL — ABNORMAL LOW (ref 8.9–10.3)
Chloride: 107 mmol/L (ref 98–111)
Creatinine, Ser: 1.12 mg/dL (ref 0.61–1.24)
GFR calc Af Amer: >60 mL/min (ref >60)
GFR calc non Af Amer: >60 mL/min (ref >60)
Glucose, Bld: 156 mg/dL — ABNORMAL HIGH (ref 70–99)
Potassium: 3.9 mmol/L (ref 3.5–5.1)
Sodium: 138 mmol/L (ref 135–145)
Total Bilirubin: 1.4 mg/dL — ABNORMAL HIGH (ref 0.3–1.2)
Total Protein: 7.8 g/dL (ref 6.5–8.1)

## 2020-03-26 LAB — CBC
HCT: 42 % (ref 39.0–52.0)
Hemoglobin: 13.5 g/dL (ref 13.0–17.0)
MCH: 28.8 pg (ref 26.0–34.0)
MCHC: 32.1 g/dL (ref 30.0–36.0)
MCV: 89.6 fL (ref 80.0–100.0)
Platelets: 197 10*3/uL (ref 150–400)
RBC: 4.69 MIL/uL (ref 4.22–5.81)
RDW: 15.9 % — ABNORMAL HIGH (ref 11.5–15.5)
WBC: 5 10*3/uL (ref 4.0–10.5)
nRBC: 0 % (ref 0.0–0.2)

## 2020-03-26 LAB — LACTIC ACID, PLASMA
Lactic Acid, Venous: 2.4 mmol/L (ref 0.5–1.9)
Lactic Acid, Venous: 0.3 mmol/L — ABNORMAL LOW (ref 0.5–1.9)

## 2020-03-26 LAB — CBG MONITORING, ED: Glucose-Capillary: 145 mg/dL — ABNORMAL HIGH (ref 70–99)

## 2020-03-26 LAB — GLUCOSE, CAPILLARY: Glucose-Capillary: 151 mg/dL — ABNORMAL HIGH (ref 70–99)

## 2020-03-26 LAB — SARS CORONAVIRUS 2 BY RT PCR (HOSPITAL ORDER, PERFORMED IN ~~LOC~~ HOSPITAL LAB): SARS Coronavirus 2: NEGATIVE

## 2020-03-26 MED ORDER — TRIAMCINOLONE ACETONIDE 0.1 % EX CREA
1.0000 "application " | TOPICAL_CREAM | Freq: Two times a day (BID) | CUTANEOUS | Status: DC
  Administered 2020-03-27 – 2020-03-28 (×3): 1 via TOPICAL
  Filled 2020-03-26: qty 15

## 2020-03-26 MED ORDER — LISINOPRIL 10 MG PO TABS
10.0000 mg | ORAL_TABLET | Freq: Every day | ORAL | Status: DC
  Administered 2020-03-27 – 2020-03-28 (×2): 10 mg via ORAL
  Filled 2020-03-26 (×2): qty 1

## 2020-03-26 MED ORDER — SODIUM CHLORIDE 0.9% FLUSH
3.0000 mL | Freq: Two times a day (BID) | INTRAVENOUS | Status: DC
  Administered 2020-03-26 – 2020-03-28 (×4): 3 mL via INTRAVENOUS

## 2020-03-26 MED ORDER — ENOXAPARIN SODIUM 40 MG/0.4ML ~~LOC~~ SOLN
40.0000 mg | SUBCUTANEOUS | Status: DC
  Administered 2020-03-26 – 2020-03-27 (×2): 40 mg via SUBCUTANEOUS
  Filled 2020-03-26 (×2): qty 0.4

## 2020-03-26 MED ORDER — DILTIAZEM LOAD VIA INFUSION
20.0000 mg | Freq: Once | INTRAVENOUS | Status: AC
  Administered 2020-03-26: 20 mg via INTRAVENOUS
  Filled 2020-03-26: qty 20

## 2020-03-26 MED ORDER — DILTIAZEM HCL-DEXTROSE 125-5 MG/125ML-% IV SOLN (PREMIX): 5.0000 mg/h | INTRAVENOUS | Status: DC

## 2020-03-26 MED ORDER — INSULIN ASPART 100 UNIT/ML ~~LOC~~ SOLN: 0.0000 [IU] | Freq: Every day | SUBCUTANEOUS | Status: DC

## 2020-03-26 MED ORDER — SODIUM CHLORIDE 0.9 % IV SOLN: 250.0000 mL | INTRAVENOUS | Status: DC | PRN

## 2020-03-26 MED ORDER — HYDROXYZINE HCL 10 MG PO TABS
10.0000 mg | ORAL_TABLET | Freq: Three times a day (TID) | ORAL | Status: DC | PRN
  Filled 2020-03-26: qty 1

## 2020-03-26 MED ORDER — INSULIN DETEMIR 100 UNIT/ML ~~LOC~~ SOLN
5.0000 [IU] | Freq: Every day | SUBCUTANEOUS | Status: DC
  Administered 2020-03-27: 5 [IU] via SUBCUTANEOUS
  Filled 2020-03-26 (×2): qty 0.05

## 2020-03-26 MED ORDER — INSULIN ASPART 100 UNIT/ML ~~LOC~~ SOLN: 0.0000 [IU] | Freq: Three times a day (TID) | SUBCUTANEOUS | Status: DC

## 2020-03-26 MED ORDER — INSULIN ASPART 100 UNIT/ML ~~LOC~~ SOLN
4.0000 [IU] | Freq: Three times a day (TID) | SUBCUTANEOUS | Status: DC
  Administered 2020-03-27 (×3): 4 [IU] via SUBCUTANEOUS

## 2020-03-26 MED ORDER — DILTIAZEM HCL 60 MG PO TABS
60.0000 mg | ORAL_TABLET | Freq: Four times a day (QID) | ORAL | Status: DC
  Administered 2020-03-27 – 2020-03-28 (×6): 60 mg via ORAL
  Filled 2020-03-26 (×6): qty 1

## 2020-03-26 MED ORDER — CEPHALEXIN 500 MG PO CAPS
500.0000 mg | ORAL_CAPSULE | Freq: Four times a day (QID) | ORAL | Status: DC
  Administered 2020-03-27 – 2020-03-28 (×7): 500 mg via ORAL
  Filled 2020-03-26 (×7): qty 1

## 2020-03-26 MED ORDER — DILTIAZEM HCL-DEXTROSE 125-5 MG/125ML-% IV SOLN (PREMIX)
5.0000 mg/h | INTRAVENOUS | Status: DC
  Administered 2020-03-26: 5 mg/h via INTRAVENOUS
  Filled 2020-03-26 (×2): qty 125

## 2020-03-26 MED ORDER — ACETAMINOPHEN 500 MG PO TABS: 500.0000 mg | ORAL_TABLET | Freq: Four times a day (QID) | ORAL | Status: DC | PRN

## 2020-03-26 MED ORDER — SODIUM CHLORIDE 0.9 % IV BOLUS: 1300.0000 mL | Freq: Once | INTRAVENOUS | Status: DC

## 2020-03-26 MED ORDER — SODIUM CHLORIDE 0.9% FLUSH: 3.0000 mL | INTRAVENOUS | Status: DC | PRN

## 2020-03-26 MED ORDER — SODIUM CHLORIDE 0.9 % IV SOLN
2.0000 g | INTRAVENOUS | Status: DC
  Administered 2020-03-26: 2 g via INTRAVENOUS
  Filled 2020-03-26: qty 20

## 2020-03-26 MED ORDER — POLYETHYLENE GLYCOL 3350 17 G PO PACK: 17.0000 g | PACK | Freq: Every day | ORAL | Status: DC | PRN

## 2020-03-26 MED ORDER — VANCOMYCIN HCL IN DEXTROSE 1-5 GM/200ML-% IV SOLN
1000.0000 mg | Freq: Once | INTRAVENOUS | Status: AC
  Administered 2020-03-26: 1000 mg via INTRAVENOUS
  Filled 2020-03-26: qty 200

## 2020-03-26 MED ORDER — SODIUM CHLORIDE 0.9 % IV BOLUS
1000.0000 mL | Freq: Once | INTRAVENOUS | Status: AC
  Administered 2020-03-26: 1000 mL via INTRAVENOUS

## 2020-03-26 MED ORDER — SODIUM CHLORIDE 0.9 % IV BOLUS
1700.0000 mL | Freq: Once | INTRAVENOUS | Status: AC
  Administered 2020-03-26: 1700 mL via INTRAVENOUS

## 2020-03-26 MED ORDER — SODIUM CHLORIDE 0.9 % IV SOLN: INTRAVENOUS | Status: DC

## 2020-03-26 NOTE — ED Notes (Signed)
CRITICAL VALUE STICKER  CRITICAL VALUE: Lactic 2.4  RECEIVER (on-site recipient of call): Jule Ser (RN)  DATE & TIME NOTIFIED: 03/26/2020  MESSENGER (representative from lab): Midge Aver  MD NOTIFIED: Lorre Nick  TIME OF NOTIFICATION: 2:25  RESPONSE:

## 2020-03-26 NOTE — ED Notes (Signed)
Attempted to call report to [facility], but staff states there is no RN assigned to the pt and they are unable to take report at this time.

## 2020-03-26 NOTE — ED Provider Notes (Addendum)
[facility]-EMERGENCY DEPT Provider Note   CSN: [id number] Arrival date & time: 03/26/20  1150     History Chief Complaint  Patient presents with  . Psoriasis  . Cellulitis    Casey Bell is a 64 y.o. male.  64 year old male who presents with increased discomfort from his diffuse psoriasis.  Is concerned he may have an infection because of this.  He denies any fever.  No vomiting.  Has had intense pruritus.  Has not been able to see his PCP for quite some time.  Does have a history of diabetes denies any history of hypertension.  No treatment use prior to arrival        Past Medical History:  Diagnosis Date  . Diabetes mellitus without complication (HCC)   . Psoriasis .  [name] Psoriasis     There are no problems to display for this patient.   No past surgical history on file.     No family history on file.  Social History   Tobacco Use  . Smoking status: Former Smoker    Quit date: 2010    Years since quitting: 11.6  . Smokeless tobacco: Never Used  Substance Use Topics  . Alcohol use: Yes    Comment: few/week  . Drug use: Yes    Types: Marijuana    Home Medications Prior to Admission medications   Medication Sig Start Date End Date Taking? Authorizing Provider  acetaminophen (TYLENOL) 500 MG tablet Take 500 mg by mouth every 6 (six) hours as needed for moderate pain.    [provider]  Clobetasol Propionate 0.05 % lotion Apply 118 mLs topically 2 (two) times daily. 03/23/20   [name], NP  Clobetasol Propionate 0.05 % shampoo Apply 0.5 mLs topically 2 (two) times daily. 03/23/20   [name], NP  Emollient Hinton Dyer) LOTN Apply 15 application topically in the morning and at bedtime. 01/27/20   Mancel Bale, MD  hydrOXYzine (ATARAX/VISTARIL) 10 MG tablet Take 1 tablet (10 mg total) by mouth 3 (three) times daily as needed. 03/23/20   [name], NP  lisinopril (PRINIVIL,ZESTRIL) 10 MG tablet Take 1  tablet (10 mg total) by mouth daily. Patient not taking: Reported on 01/27/2020 04/21/18   Loletta Specter, PA-C  metFORMIN (GLUCOPHAGE) 1000 MG tablet Take 1 tablet (1,000 mg total) by mouth 2 (two) times daily with a meal. Patient taking differently: Take 500 mg by mouth 2 (two) times daily with a meal.  01/19/18   Loletta Specter, PA-C  triamcinolone cream (KENALOG) 0.1 % Apply 1 application topically 2 (two) times daily. Use 5 to 10 cc, with each application, from the neck to the toes.  Apply this with Lubriderm to extend the treatment to a bigger part of your body. 01/27/20   Mancel Bale, MD    Allergies    Patient has no known allergies.  Review of Systems   Review of Systems  All other systems reviewed and are negative.   Physical Exam Updated Vital Signs BP (!) 171/121 (BP Location: Right Arm)   Pulse (!) 147   Temp 98.9 F (37.2 C) (Oral)   Resp 17   Ht 1.803 m (5\' 11" )   Wt 90.7 kg   SpO2 100%   BMI 27.89 kg/m   Physical Exam Vitals and nursing note reviewed.  Constitutional:      General: He is not in acute distress.    Appearance: Normal appearance. He is well-developed. He  is not toxic-appearing.  HENT:     Head: Normocephalic and atraumatic.  Eyes:     General: Lids are normal.     Conjunctiva/sclera: Conjunctivae normal.     Pupils: Pupils are equal, round, and reactive to light.  Neck:     Thyroid: No thyroid mass.     Trachea: No tracheal deviation.  Cardiovascular:     Rate and Rhythm: Regular rhythm. Tachycardia present.     Heart sounds: Normal heart sounds. No murmur heard.  No gallop.   Pulmonary:     Effort: Pulmonary effort is normal. No respiratory distress.     Breath sounds: Normal breath sounds. No stridor. No decreased breath sounds, wheezing, rhonchi or rales.  Abdominal:     General: Bowel sounds are normal. There is no distension.     Palpations: Abdomen is soft.     Tenderness: There is no abdominal tenderness. There is no  rebound.  Musculoskeletal:        General: No tenderness. Normal range of motion.     Cervical back: Normal range of motion and neck supple.  Skin:    General: Skin is warm and dry.     Findings: Rash present. No abrasion. Rash is crusting and scaling.     Comments: Large diffuse skin lesions consistent with psoriasis.  No purulent drainage noted.  Some increased erythema to bilateral lower extremities.  Neurological:     Mental Status: He is alert and oriented to person, place, and time.     GCS: GCS eye subscore is 4. GCS verbal subscore is 5. GCS motor subscore is 6.     Cranial Nerves: No cranial nerve deficit.     Sensory: No sensory deficit.  Psychiatric:        Speech: Speech normal.        Behavior: Behavior normal.     ED Results / Procedures / Treatments   Labs (all labs ordered are listed, but only abnormal results are displayed) Labs Reviewed  CBC  URINALYSIS, ROUTINE W REFLEX MICROSCOPIC  COMPREHENSIVE METABOLIC PANEL  LACTIC ACID, PLASMA  CBG MONITORING, ED    EKG EKG Interpretation  Date/Time:  Sunday March 26 2020 15:32:50 EDT Ventricular Rate:  144 PR Interval:    QRS Duration: 74 QT Interval:  351 QTC Calculation: 544 R Axis:   -99 Text Interpretation: Atrial flutter with predominant 2:1 AV block Inferior infarct, old Prolonged QT interval Confirmed by Lorre Nick ([id number]) on 03/26/2020 3:36:16 PM   Radiology No results found.  Procedures Procedures (including critical care time)  Medications Ordered in ED Medications  0.9 %  sodium chloride infusion (has no administration in time range)    ED Course  I have reviewed the triage vital signs and the nursing notes.  Pertinent labs & imaging results that were available during my care of the patient were reviewed by me and considered in my medical decision making (see chart for details).    MDM Rules/Calculators/A&P                          Patient with persistent tachycardia as well as  elevated lactate .  Patient is EKG concerning for atrial fib versus a flutter.  Will bolus with Cardizem and started on Cardizem drip.  Concern for possible bacteremia from his cellulitis.  Will order fluid boluses.  Vancomycin started.  Will admit to the hospital service  CRITICAL CARE Performed by: Toy Baker Total  critical care time: 50 minutes Critical care time was exclusive of separately billable procedures and treating other patients. Critical care was necessary to treat or prevent imminent or life-threatening deterioration. Critical care was time spent personally by me on the following activities: development of treatment plan with patient and/or surrogate as well as nursing, discussions with consultants, evaluation of patient's response to treatment, examination of patient, obtaining history from patient or surrogate, ordering and performing treatments and interventions, ordering and review of laboratory studies, ordering and review of radiographic studies, pulse oximetry and re-evaluation of patient's condition.  Final Clinical Impression(s) / ED Diagnoses Final diagnoses:  None    Rx / DC Orders ED Discharge Orders    None       Lorre Nick, MD 03/26/20 1526    Lorre Nick, MD 03/26/20 1537

## 2020-03-26 NOTE — H&P (Signed)
History and Physical  Casey Bell:[id number] DOB: [birth year] DOA: 03/26/2020  PCP: [name], NP Patient coming from: home  I have personally briefly reviewed patient's old medical records in [facility]   Chief Complaint: Left leg pain  HPI: Casey Bell is a 64 y.o. male past medical history of psoriasis, hypertension and diabetes who comes in for left leg pain, he relates he has not seen his PCP for over a year and has not had his psoriasis treated, he woke up on the day of admission with left leg pain and bleeding, he denies any fever vomiting nausea or diarrhea.  He also relates foul-smelling from his socks when he took them off in the morning.  In the ED: A. fib with RVR with a blood pressure of 171/21, afebrile with no leukocytosis   Review of Systems: All systems reviewed and apart from history of presenting illness, are negative.  Past Medical History:  Diagnosis Date  . Diabetes mellitus without complication (HCC)   . Psoriasis .  [name] Psoriasis    No past surgical history on file. Social History:  reports that he quit smoking about 11 years ago. He has never used smokeless tobacco. He reports current alcohol use. He reports current drug use. Drug: Marijuana.   No Known Allergies  Family History  Problem Relation Age of Onset  . Chronic Renal Failure Mother   . Heart failure Mother     Prior to Admission medications   Medication Sig Start Date End Date Taking? Authorizing Provider  acetaminophen (TYLENOL) 500 MG tablet Take 500 mg by mouth every 6 (six) hours as needed for moderate pain.   Yes [provider]  Clobetasol Propionate 0.05 % lotion Apply 118 mLs topically 2 (two) times daily. 03/23/20  Yes [name], NP  Clobetasol Propionate 0.05 % shampoo Apply 0.5 mLs topically 2 (two) times daily. 03/23/20  Yes Edwards, Kinnie Scales, NP  Emollient (LUBRIDERM) LOTN Apply 15 application topically in the morning and at  bedtime. 01/27/20  Yes Mancel Bale, MD  hydrOXYzine (ATARAX/VISTARIL) 10 MG tablet Take 1 tablet (10 mg total) by mouth 3 (three) times daily as needed. Patient taking differently: Take 10 mg by mouth 3 (three) times daily as needed for itching.  03/23/20  Yes [name], NP  metFORMIN (GLUCOPHAGE) 1000 MG tablet Take 1 tablet (1,000 mg total) by mouth 2 (two) times daily with a meal. Patient taking differently: Take 500 mg by mouth 2 (two) times daily with a meal.  01/19/18  Yes Loletta Specter, PA-C  naproxen sodium (ALEVE) 220 MG tablet Take 220 mg by mouth 2 (two) times daily as needed (pain).   Yes [provider]  triamcinolone cream (KENALOG) 0.1 % Apply 1 application topically 2 (two) times daily. Use 5 to 10 cc, with each application, from the neck to the toes.  Apply this with Lubriderm to extend the treatment to a bigger part of your body. 01/27/20  Yes Mancel Bale, MD  lisinopril (PRINIVIL,ZESTRIL) 10 MG tablet Take 1 tablet (10 mg total) by mouth daily. Patient not taking: Reported on 01/27/2020 04/21/18   Loletta Specter, PA-C   Physical Exam: Vitals:   03/26/20 1258 03/26/20 1325 03/26/20 1515 03/26/20 1525  BP: (!) 171/121 (!) 166/101 (!) 164/118 (!) 164/118  Pulse:  (!) 133 (!) 122 (!) 136  Resp:  19 19 17   Temp:  98.6 F (37 C)  98.6 F (37 C)  TempSrc:  Oral  Oral  SpO2:  100% 98% 99%  Weight:      Height:         General exam: Moderately built and nourished patient, lying comfortably supine on the gurney in no obvious distress.  Head, eyes and ENT: Nontraumatic and normocephalic. Pupils equally reacting to light and accommodation. Oral mucosa moist.  Neck: Supple. No JVD, carotid bruit or thyromegaly.  Lymphatics: No lymphadenopathy.  Respiratory system: Clear to auscultation. No increased work of breathing.  Cardiovascular system: S1 and S2 heard, RRR. No JVD, murmurs, gallops, clicks or pedal edema.  Gastrointestinal system: Abdomen  is nondistended, soft and nontender. Normal bowel sounds heard.   Extremities: Symmetric 5 x 5 power. Peripheral pulses symmetrically felt.   Skin: Diffuse psoriasis of the skin, his left leg is tender to palpation and warm no purulent discharge or bleeding.  I cannot appreciate any purulent drainage.  Musculoskeletal system: Negative exam.  Psychiatry: Pleasant and cooperative.   Labs on Admission:  Basic Metabolic Panel: Recent Labs  Lab 03/26/20 1328  NA 138  K 3.9  CL 107  CO2 21*  GLUCOSE 156*  BUN 12  CREATININE 1.12  CALCIUM 8.6*   Liver Function Tests: Recent Labs  Lab 03/26/20 1328  AST 46*  ALT 26  ALKPHOS 134*  BILITOT 1.4*  PROT 7.8  ALBUMIN 3.3*   No results for input(s): LIPASE, AMYLASE in the last 168 hours. No results for input(s): AMMONIA in the last 168 hours. CBC: Recent Labs  Lab 03/26/20 1328  WBC 5.0  HGB 13.5  HCT 42.0  MCV 89.6  PLT 197   Cardiac Enzymes: No results for input(s): CKTOTAL, CKMB, CKMBINDEX, TROPONINI in the last 168 hours.  BNP (last 3 results) No results for input(s): PROBNP in the last 8760 hours. CBG: Recent Labs  Lab 03/26/20 1319  GLUCAP 145*    Radiological Exams on Admission: No results found.  EKG: Independently reviewed.  Lead EKG showed A. fib with RVR with left axis deviation nonspecific T wave changes.  Assessment/Plan A. fib with RVR in the setting of infectious etiology: We will go ahead and start on diltiazem drip, check a 2D echo, and if he does not convert to sinus rhythm over the next 48 hours he will have to be started on anticoagulation. His chads Vasc 4 is greater than 2. Sepsis has been ruled out his elevated lactic acid is likely due to his A. fib with RVR.  Cellulitis of leg, left: Sepsis has been ruled out, will start empirically on oral Keflex, blood cultures have already been ordered in the ED. We will need further evaluation as an outpatient by dermatologist.  Elevated  LFTs: With a mildly total bilirubin, elevated alkaline phosphatase and AST slightly elevated putting 2:1 ratio compared to ALT question alcohol abuse which he denies.  We will check a abdominal ultrasound and acute hepatitis panel. UDS was positive for cannabis, he has been counseled.  Mild hypocalcemia: Once corrected for albumin is within normal range.    DVT Prophylaxis: lovenxo Code Status: full  Family Communication: sister  Disposition Plan: inpatient    It is my clinical opinion that admission to INPATIENT is reasonable and necessary in this 64 y.o. male comes in with A. fib and RVR in the setting of cellulitis, treated with IV diltiazem and oral antibiotics.  Given the aforementioned, the predictability of an adverse outcome is felt to be significant. I expect that the patient will require at least  2 midnights in the hospital to treat this condition.  Marinda Elk MD [facility]   03/26/2020, 4:23 PM

## 2020-03-26 NOTE — ED Notes (Signed)
Patient acuity 2 due to bp of 171/221 and heart rate of 146

## 2020-03-26 NOTE — Plan of Care (Signed)

## 2020-03-26 NOTE — ED Triage Notes (Signed)
Patient reports he has been scratching his psoriasis areas and now his skin is infected. Patient says he cannot walk due to the pain. Pain is 10/10, patient states he was being treated by pcp but hasn't been in over a year due to covid.

## 2020-03-26 NOTE — ED Notes (Signed)
ED TO INPATIENT HANDOFF REPORT  Name/Age/Gender Casey Bell 64 y.o. male  Code Status   Home/SNF/Other Home  Chief Complaint Atrial fibrillation with RVR (HCC) [I48.91] Atrial fibrillation and flutter (HCC) [I48.91, I48.92]  Level of Care/Admitting Diagnosis ED Disposition    ED Disposition Condition Comment   Admit  Hospital Area: [facility] [[id number]]  Level of Care: Telemetry [5]  Admit to tele based on following criteria: Complex arrhythmia (Bradycardia/Tachycardia)  May admit patient to [facility] or [facility] if equivalent level of care is available:: Yes  Covid Evaluation: Asymptomatic Screening Protocol (No Symptoms)  Diagnosis: Atrial fibrillation with RVR [facility]) [938182]  Admitting Physician: Marinda Elk [[id number]]  Attending Physician: Marinda Elk [[id number]]  Estimated length of stay: past midnight tomorrow  Certification:: I certify this patient will need inpatient services for at least 2 midnights       Medical History Past Medical History:  Diagnosis Date  . Diabetes mellitus without complication (HCC)   . Psoriasis .  [name] Psoriasis     Allergies No Known Allergies  IV Location/Drains/Wounds Patient Lines/Drains/Airways Status    Active Line/Drains/Airways    Name Placement date Placement time Site Days   Peripheral IV 03/26/20 Anterior;Left Hand 03/26/20  1550  Hand  less than 1   Peripheral IV 03/26/20 Left Antecubital 03/26/20  1327  Antecubital  less than 1          Labs/Imaging Results for orders placed or performed during the hospital encounter of 03/26/20 (from the past 48 hour(s))  CBG monitoring, ED     Status: Abnormal   Collection Time: 03/26/20  1:19 PM  Result Value Ref Range   Glucose-Capillary 145 (H) 70 - 99 mg/dL    Comment: Glucose reference range applies only to samples taken after fasting for at least 8 hours.  CBC     Status: Abnormal   Collection Time: 03/26/20  1:28 PM  Result Value  Ref Range   WBC 5.0 4.0 - 10.5 K/uL   RBC 4.69 4.22 - 5.81 MIL/uL   Hemoglobin 13.5 13.0 - 17.0 g/dL   HCT 99.3 39 - 52 %   MCV 89.6 80.0 - 100.0 fL   MCH 28.8 26.0 - 34.0 pg   MCHC 32.1 30.0 - 36.0 g/dL   RDW 71.6 (H) 96.7 - 89.3 %   Platelets 197 150 - 400 K/uL   nRBC 0.0 0.0 - 0.2 %    Comment: Performed at [facility], [address] [place], [place] [address]  Urinalysis, Routine w reflex microscopic     Status: Abnormal   Collection Time: 03/26/20  1:28 PM  Result Value Ref Range   Color, Urine YELLOW YELLOW   APPearance CLEAR CLEAR   Specific Gravity, Urine 1.020 1.005 - 1.030   pH 5.0 5.0 - 8.0   Glucose, UA NEGATIVE NEGATIVE mg/dL   Hgb urine dipstick NEGATIVE NEGATIVE   Bilirubin Urine NEGATIVE NEGATIVE   Ketones, ur NEGATIVE NEGATIVE mg/dL   Protein, ur 30 (A) NEGATIVE mg/dL   Nitrite NEGATIVE NEGATIVE   Leukocytes,Ua NEGATIVE NEGATIVE   RBC / HPF 0-5 0 - 5 RBC/hpf   WBC, UA 0-5 0 - 5 WBC/hpf   Bacteria, UA NONE SEEN NONE SEEN   Squamous Epithelial / LPF 0-5 0 - 5   Mucus PRESENT     Comment: Performed at [facility], [address], [place], [place] [address]  Comprehensive metabolic panel  Status: Abnormal   Collection Time: 03/26/20  1:28 PM  Result Value Ref Range   Sodium 138 135 - 145 mmol/L   Potassium 3.9 3.5 - 5.1 mmol/L   Chloride 107 98 - 111 mmol/L   CO2 21 (L) 22 - 32 mmol/L   Glucose, Bld 156 (H) 70 - 99 mg/dL    Comment: Glucose reference range applies only to samples taken after fasting for at least 8 hours.   BUN 12 8 - 23 mg/dL   Creatinine, Ser 0.25 0.61 - 1.24 mg/dL   Calcium 8.6 (L) 8.9 - 10.3 mg/dL   Total Protein 7.8 6.5 - 8.1 g/dL   Albumin 3.3 (L) 3.5 - 5.0 g/dL   AST 46 (H) 15 - 41 U/L   ALT 26 0 - 44 U/L   Alkaline Phosphatase 134 (H) 38 - 126 U/L   Total Bilirubin 1.4 (H) 0.3 - 1.2 mg/dL   GFR calc non Af Amer >60 >60 mL/min   GFR calc Af Amer >60 >60 mL/min   Anion gap 10 5 - 15     Comment: Performed at [facility], [address], [place], [place] [address]  Lactic acid, plasma     Status: Abnormal   Collection Time: 03/26/20  1:28 PM  Result Value Ref Range   Lactic Acid, Venous 2.4 (HH) 0.5 - 1.9 mmol/L    Comment: CRITICAL RESULT CALLED TO, READ BACK BY AND VERIFIED WITH: Jaynie Collins RN [id number] 03/26/20 JM Performed at [facility], [address], [place], [place] [address]   Culture, blood (Routine X 2) w Reflex to ID Panel     Status: None (Preliminary result)   Collection Time: 03/26/20  1:28 PM   Specimen: BLOOD  Result Value Ref Range   Specimen Description      BLOOD LEFT ANTECUBITAL Performed at [facility], [address], [place], [place] [address]    Special Requests      BOTTLES DRAWN AEROBIC AND ANAEROBIC Blood Culture adequate volume Performed at [facility], [address] [place], [place] [address]    Culture PENDING    Report Status PENDING   Lactic acid, plasma     Status: Abnormal   Collection Time: 03/26/20  5:44 PM  Result Value Ref Range   Lactic Acid, Venous 0.3 (L) 0.5 - 1.9 mmol/L    Comment: Performed at [facility], [address], [place], [place] [address]  SARS Coronavirus 2 by RT PCR (hospital order, performed in [facility] hospital lab) Nasopharyngeal Nasopharyngeal Swab     Status: None   Collection Time: 03/26/20  5:44 PM   Specimen: Nasopharyngeal Swab  Result Value Ref Range   SARS Coronavirus 2 NEGATIVE NEGATIVE    Comment: (NOTE) SARS-CoV-2 target nucleic acids are NOT DETECTED.  The SARS-CoV-2 RNA is generally detectable in upper and lower respiratory specimens during the acute phase of infection. The lowest concentration of SARS-CoV-2 viral copies this assay can detect is 250 copies / mL. A negative result does not preclude SARS-CoV-2 infection and should not be used as the sole basis for treatment or other patient management  decisions.  A negative result may occur with improper specimen collection / handling, submission of specimen other than nasopharyngeal swab, presence of viral mutation(s) within the areas targeted by this assay, and inadequate number of viral copies (<250 copies / mL). A negative result must be combined with clinical observations, patient history, and  epidemiological information.  Fact Sheet for Patients:   BoilerBrush.com.cy  Fact Sheet for Healthcare Providers: https://pope.com/  This test is not yet approved or  cleared by the [place] FDA and has been authorized for detection and/or diagnosis of SARS-CoV-2 by FDA under an Emergency Use Authorization (EUA).  This EUA will remain in effect (meaning this test can be used) for the duration of the COVID-19 declaration under Section 564(b)(1) of the Act, 21 U.S.C. section 360bbb-3(b)(1), unless the authorization is terminated or revoked sooner.  Performed at [facility], [address], [place], [place] [address]    No results found.  Pending Labs [employer] (From admission, onward) Comment          Start     Ordered   Signed and Held  HIV Antibody (routine testing w rflx)  (HIV Antibody (Routine testing w reflex) panel)  Once,   R        Signed and Held   Signed and Held  CBC  (enoxaparin (LOVENOX)    CrCl >/= 30 ml/min)  Once,   R       Comments: Baseline for enoxaparin therapy IF NOT ALREADY DRAWN.  Notify MD if PLT < 100 K.    Signed and Held   Signed and Held  Creatinine, serum  (enoxaparin (LOVENOX)    CrCl >/= 30 ml/min)  Once,   R       Comments: Baseline for enoxaparin therapy IF NOT ALREADY DRAWN.    Signed and Held   Signed and Held  Creatinine, serum  (enoxaparin (LOVENOX)    CrCl >/= 30 ml/min)  Weekly,   R     Comments: while on enoxaparin therapy    Signed and Held   Signed and Held  CBC  Tomorrow morning,   R        Signed and  Held   Signed and Held  Basic metabolic panel  Tomorrow morning,   R        Signed and Held   Signed and Held  Hepatitis panel, acute  Once,   R        Signed and Held          Vitals/Pain Today's Vitals   03/26/20 2000 03/26/20 2005 03/26/20 2030 03/26/20 2046  BP: (!) 127/100 (!) 147/113 (!) 134/94   Pulse: 100 (!) 122  100  Resp: (!) 25 11  (!) 24  Temp:      TempSrc:      SpO2: 95% 95%  95%  Weight:      Height:      PainSc:        Isolation Precautions No active isolations  Medications Medications  0.9 %  sodium chloride infusion ( Intravenous Stopped 03/26/20 1711)  cefTRIAXone (ROCEPHIN) 2 g in sodium chloride 0.9 % 100 mL IVPB (0 g Intravenous Stopped 03/26/20 1552)  diltiazem (CARDIZEM) 1 mg/mL load via infusion 20 mg (20 mg Intravenous Bolus from Bag 03/26/20 1709)    And  diltiazem (CARDIZEM) 125 mg in dextrose 5% 125 mL (1 mg/mL) infusion (10 mg/hr Intravenous Rate/Dose Change 03/26/20 1835)  vancomycin (VANCOCIN) IVPB 1000 mg/200 mL premix (0 mg Intravenous Stopped 03/26/20 1711)  sodium chloride 0.9 % bolus 1,000 mL (0 mLs Intravenous Stopped 03/26/20 1838)  sodium chloride 0.9 % bolus 1,700 mL (0 mLs Intravenous Stopped 03/26/20 1838)    Mobility walks

## 2020-03-27 ENCOUNTER — Inpatient Hospital Stay (HOSPITAL_COMMUNITY): Payer: Self-pay

## 2020-03-27 DIAGNOSIS — I4892 Unspecified atrial flutter: Secondary | ICD-10-CM

## 2020-03-27 DIAGNOSIS — I4891 Unspecified atrial fibrillation: Secondary | ICD-10-CM

## 2020-03-27 LAB — HEPATITIS PANEL, ACUTE
HCV Ab: NONREACTIVE
Hep A IgM: NONREACTIVE
Hep B C IgM: NONREACTIVE
Hepatitis B Surface Ag: NONREACTIVE

## 2020-03-27 LAB — CBC
HCT: 44.2 % (ref 39.0–52.0)
Hemoglobin: 13.6 g/dL (ref 13.0–17.0)
MCH: 28.6 pg (ref 26.0–34.0)
MCHC: 30.8 g/dL (ref 30.0–36.0)
MCV: 93.1 fL (ref 80.0–100.0)
Platelets: 168 10*3/uL (ref 150–400)
RBC: 4.75 MIL/uL (ref 4.22–5.81)
RDW: 16.5 % — ABNORMAL HIGH (ref 11.5–15.5)
WBC: 5.7 10*3/uL (ref 4.0–10.5)
nRBC: 0 % (ref 0.0–0.2)

## 2020-03-27 LAB — GLUCOSE, CAPILLARY
Glucose-Capillary: 102 mg/dL — ABNORMAL HIGH (ref 70–99)
Glucose-Capillary: 98 mg/dL (ref 70–99)
Glucose-Capillary: 105 mg/dL — ABNORMAL HIGH (ref 70–99)
Glucose-Capillary: 95 mg/dL (ref 70–99)

## 2020-03-27 LAB — HIV ANTIBODY (ROUTINE TESTING W REFLEX): HIV Screen 4th Generation wRfx: NONREACTIVE

## 2020-03-27 MED FILL — CLOBETASOL 0.05% TOPICAL LO: 0.05 | 30 days supply | Qty: 118 | Fill #0

## 2020-03-27 NOTE — TOC Initial Note (Signed)
Transition of Care [facility]) - Initial/Assessment Note    Patient Details  Name: Casey Bell MRN: [id number] Date of Birth: 1956-06-20  Transition of Care [facility]) CM/SW Contact:    Lanier Clam, RN Phone Number: 03/27/2020, 1:25 PM  Clinical Narrative: Spoke to patient about d/c plans-stated he needs a place to go-has been staying with friends;has a sister to pick him up @ d/c. Provided w/shelter list/recc [facility] for day shelter;provided with day shelter list for continued stay @ night-patient aware he has to call on own to answer specific questions they may have. He states he can afford his meds if reasonable-informed his meds are around $3 each-he can afford. If d/c on eliquis/xarelto-he will receive a 30 day free discount coupon from pharmacy.   Pharmacy-[facility], has pcp-Michelle Edwards-[facility]. Has transport to pcp office. Financial navigator following for [facility]. No further CM needs.               Expected Discharge Plan: Homeless Shelter Barriers to Discharge: Continued Medical Work up   Patient Goals and CMS Choice Patient states their goals for this hospitalization and ongoing recovery are:: go to shelter CMS Medicare.gov Compare Post Acute Care list provided to:: Patient Choice offered to / list presented to : Patient  Expected Discharge Plan and Services Expected Discharge Plan: Homeless Shelter   Discharge Planning Services: CM Consult Post Acute Care Choice: NA Living arrangements for the past 2 months: Homeless                                      Prior Living Arrangements/Services Living arrangements for the past 2 months: Homeless Lives with:: Other (Comment) (staying with friends) Patient language and need for interpreter reviewed:: Yes Do you feel safe going back to the place where you live?: Yes      Need for Family Participation in Patient Care: No (Comment) Care giver support system in place?: Yes (comment)    Criminal Activity/Legal Involvement Pertinent to Current Situation/Hospitalization: No - Comment as needed  Activities of Daily Living Home Assistive Devices/Equipment: None ADL Screening (condition at time of admission) Patient's cognitive ability adequate to safely complete daily activities?: Yes Is the patient deaf or have difficulty hearing?: No Does the patient have difficulty seeing, even when wearing glasses/contacts?: No Does the patient have difficulty concentrating, remembering, or making decisions?: No Patient able to express need for assistance with ADLs?: Yes Does the patient have difficulty dressing or bathing?: No Independently performs ADLs?: Yes (appropriate for developmental age) Does the patient have difficulty walking or climbing stairs?: No Weakness of Legs: Both Weakness of Arms/Hands: None  Permission Sought/Granted Permission sought to share information with : Case Manager Permission granted to share information with : Yes, Verbal Permission Granted  Share Information with NAME: Case Manager  Permission granted to share info w AGENCY: [facility]        Emotional Assessment Appearance:: Appears stated age Attitude/Demeanor/Rapport: Gracious Affect (typically observed): Accepting Orientation: : Oriented to Self, Oriented to Place, Oriented to  Time, Oriented to Situation Alcohol / Substance Use: Illicit Drugs Psych Involvement: No (comment)  Admission diagnosis:  Atrial fibrillation with RVR (HCC) [I48.91] Atrial fibrillation and flutter (HCC) [I48.91, I48.92] Patient Active Problem List   Diagnosis Date Noted  . Cellulitis of leg, left 03/26/2020  . Psoriasis 03/26/2020  . Atrial fibrillation with RVR (HCC) 03/26/2020  . Elevated LFTs 03/26/2020  PCP:  [name], NP Pharmacy:   [facility] - [place], [place] - [address] [place] [address] Phone: [phone] Fax: [phone]  [facility]  [id number] [place], [place] - [address] AT [facility] OF [address] & [address] [place] [address] Phone: [phone] Fax: [phone]     Social Determinants of Health (SDOH) Interventions    Readmission Risk Interventions No flowsheet data found.

## 2020-03-27 NOTE — Progress Notes (Signed)
Casey Bell  PROGRESS NOTE    [name]  Casey Bell:[id number] DOB: 12-22-1955 DOA: 03/26/2020 PCP: [name], NP  Brief Narrative:  64 year old black male DM TY 2 Prior smoker quit 2010 Psoriasis has not seen a dermatologist (has had several ED visits in 2019 for this but cannot establish care) He was seen recently to establish care 03/23/2020-was prescribed Atarax clobetasolcream and ointment as well as shampoo but did not pick up any of these prescriptions as yet (expense may preclude obtaining the same Found to have left leg pain and bleeding and also found to have new onset A. fib with RVR and was admitted for the same  Assessment & Plan:   Active Problems:   Cellulitis of leg, left   Psoriasis   Atrial fibrillation with RVR (HCC)   Elevated LFTs   1. Paroxysmal now resolved atrial fibrillation with RVR  a. Patient came off the monitors but is currently on Cardizem orally 60 every 6 which I expect we can consolidate over the next 24 hours b. If recurrence of the same with echocardiogram will determine next best rate controlling agent in addition to need for anticoagulation c. Check a.m. mag, check TSH as an outpatient 2. Left leg cellulitis a. No need for IV antibiotics at this time continue Keflex b. Continue empiric management c. We will ask wound nurse to evaluate 3. DM TY 2 A1c this admission 5.7 a. Excellent control b. Continue sliding scale c. Blood sugars ranging from 10 2-1 51 he is eating 100% of his meals 4. Transaminitis AST/ALT 2-1 a. Patient is a little bit evasive when it comes to how much he drinks b. Ultrasound abdomen pelvis i shows may be ascites and a calculus cholecystitis however he is not having any abdominal pain whatsoever c. Outpatient follow-up with PCP interval cholecystectomy as needed and referral for this 5. Cannabis use disorder a. Counseled by admitting physician 6. Hypocalcemia 7. BMI >29 8. 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/11/01 of last year-he also tells me he has not worked since then Looking through his chart, it is clear that he is functional but I am not sure if he is able to obtain work outside the home I have asked case management to comment to talk to him about the cost of some of his meds and maybe we can give him some financial assistance-ultimately he will need self-management techniques and may require assistance from social work in the outpatient setting as he has no insurance and is asking me about housing which I do not think we have any control over  Status is: Inpatient  Remains inpatient appropriate because:Hemodynamically unstable, Persistent severe electrolyte disturbances and Unsafe d/c plan   Dispo: The patient is from: Home              Anticipated d/c is to: Home              Anticipated d/c date is: 2 days              Patient currently is not medically stable to d/c.       Consultants:   Casey Bell at this time  Procedures: Casey Bell  Antimicrobials: Keflex received vancomycin in the ED   Subjective: Patient seen standing up walking around the unit without any issue He came  off telemetry for shower and he does not seem to be in A. fib when I examined him He relates long story as above He has no chest pain no fever no chills   Objective: Vitals:   03/27/20 0405 03/27/20 0500 03/27/20 0600 03/27/20 0700  BP: (!) 150/104 (!) 163/107 (!) 152/105 (!) 143/102  Pulse: 81 80 76 77  Resp:   20 (!) 23  Temp:  (!) 97.5 F (36.4 C)    TempSrc:  Oral    SpO2: 93% 94% 90% 93%  Weight:      Height:        Intake/Output Summary (Last 24 hours) at 03/27/2020 0852 Last data filed at 03/27/2020 0730 Gross per 24 hour  Intake --  Output 425 ml  Net -425 ml   Filed Weights   03/26/20 1257 03/26/20 2125   Weight: 90.7 kg 95 kg    Examination:  General exam: Awake obese pleasant no distress moderate dentition Respiratory system: Clear no added sound Cardiovascular system: S1-S2 no murmur rub or gallop seems to be in sinus rhythm right now rate controlled Gastrointestinal system: Abdomen soft protuberant no rebound. Central nervous system: Neurologically intact Extremities: ROM intact joints seem intact Skin: See pictures below      Psychiatry: Euthymic pleasant  Data Reviewed: I have personally reviewed following labs and imaging studies Hemoglobin 13, white count 5 Ultrasound shows slight ascites?  Acalculous cholecystitis-  Radiology Studies: US Abdomen Complete  Result Date: 03/27/2020 CLINICAL DATA:  Elevated liver enzymes EXAM: ABDOMEN ULTRASOUND COMPLETE COMPARISON:  Casey Bell. FINDINGS: Gallbladder: No gallstones are evident. There is thickening of the gallbladder wall with questionable mild edema within the wall. No pericholecystic fluid evident. No sonographic Murphy sign noted by sonographer. Common bile duct: Diameter: 3 mm. No intrahepatic, common hepatic, or common bile duct dilatation. Liver: No focal lesion identified. Within normal limits in parenchymal echogenicity. Portal vein is patent on color Doppler imaging with normal direction of blood flow towards the liver. IVC: No abnormality visualized. Pancreas: There is no appreciable pancreatic mass or inflammatory focus. Spleen: Size and appearance within normal limits. Right Kidney: Length: 10.0 cm. Echogenicity within normal limits. No mass or hydronephrosis visualized. Left Kidney: Length: 10.9 cm. Echogenicity within normal limits. No mass or hydronephrosis visualized. Abdominal aorta: No aneurysm visualized. Other findings: Pleural effusions are noted bilaterally. There is slight ascites. IMPRESSION: 1.  Slight ascites.  Pleural effusions bilaterally. 2. Thickened gallbladder wall noted. No gallstones evident. Gallbladder  wall thickening may be seen with ascites. Potential degree of acalculus cholecystitis must be of concern in this circumstance, however. In this regard, correlation with nuclear medicine hepatobiliary imaging study to assess for cystic duct patency may be advisable. 3.  Study otherwise unremarkable. Electronically Signed   By: Bretta Bang III M.D.   On: 03/27/2020 08:40     Scheduled Meds: . cephALEXin  500 mg Oral Q6H  . diltiazem  60 mg Oral Q6H  . enoxaparin (LOVENOX) injection  40 mg Subcutaneous Q24H  . insulin aspart  0-15 Units Subcutaneous TID WC  . insulin aspart  0-5 Units Subcutaneous QHS  . insulin aspart  4 Units Subcutaneous TID WC  . insulin detemir  5 Units Subcutaneous Daily  . lisinopril  10 mg Oral Daily  . sodium chloride flush  3 mL Intravenous Q12H  . triamcinolone cream  1 application Topical BID   Continuous Infusions: . sodium chloride 125 mL/hr at 03/27/20 0519  . sodium chloride  LOS: 1 day    Time spent: 4  Rhetta Mura, MD [facility] To contact the attending provider between 7A-7P or the covering provider during after hours 7P-7A, please log into the web site [email or web address] and access using universal [place] password for that web site. If you do not have the password, please call the hospital operator.  03/27/2020, 8:52 AM

## 2020-03-28 DIAGNOSIS — R7989 Other specified abnormal findings of blood chemistry: Secondary | ICD-10-CM

## 2020-03-28 LAB — COMPREHENSIVE METABOLIC PANEL
ALT: 28 U/L (ref 0–44)
AST: 45 U/L — ABNORMAL HIGH (ref 15–41)
Albumin: 3 g/dL — ABNORMAL LOW (ref 3.5–5.0)
Alkaline Phosphatase: 106 U/L (ref 38–126)
Anion gap: 10 (ref 5–15)
BUN: 14 mg/dL (ref 8–23)
CO2: 17 mmol/L — ABNORMAL LOW (ref 22–32)
Calcium: 8.4 mg/dL — ABNORMAL LOW (ref 8.9–10.3)
Chloride: 109 mmol/L (ref 98–111)
Creatinine, Ser: 1.07 mg/dL (ref 0.61–1.24)
GFR calc Af Amer: >60 mL/min (ref >60)
GFR calc non Af Amer: >60 mL/min (ref >60)
Glucose, Bld: 89 mg/dL (ref 70–99)
Potassium: 4.4 mmol/L (ref 3.5–5.1)
Sodium: 136 mmol/L (ref 135–145)
Total Bilirubin: 1.5 mg/dL — ABNORMAL HIGH (ref 0.3–1.2)
Total Protein: 7.2 g/dL (ref 6.5–8.1)

## 2020-03-28 LAB — CBC WITH DIFFERENTIAL/PLATELET
Abs Immature Granulocytes: 0.01 10*3/uL (ref 0.00–0.07)
Basophils Absolute: 0 10*3/uL (ref 0.0–0.1)
Basophils Relative: 1 %
Eosinophils Absolute: 0.1 10*3/uL (ref 0.0–0.5)
Eosinophils Relative: 1 %
HCT: 41.8 % (ref 39.0–52.0)
Hemoglobin: 13.5 g/dL (ref 13.0–17.0)
Immature Granulocytes: 0 %
Lymphocytes Relative: 26 %
Lymphs Abs: 1.3 10*3/uL (ref 0.7–4.0)
MCH: 28.6 pg (ref 26.0–34.0)
MCHC: 32.3 g/dL (ref 30.0–36.0)
MCV: 88.6 fL (ref 80.0–100.0)
Monocytes Absolute: 0.5 10*3/uL (ref 0.1–1.0)
Monocytes Relative: 9 %
Neutro Abs: 3.1 10*3/uL (ref 1.7–7.7)
Neutrophils Relative %: 63 %
Platelets: 189 10*3/uL (ref 150–400)
RBC: 4.72 MIL/uL (ref 4.22–5.81)
RDW: 15.8 % — ABNORMAL HIGH (ref 11.5–15.5)
WBC: 5 10*3/uL (ref 4.0–10.5)
nRBC: 0 % (ref 0.0–0.2)

## 2020-03-28 LAB — GLUCOSE, CAPILLARY
Glucose-Capillary: 68 mg/dL — ABNORMAL LOW (ref 70–99)
Glucose-Capillary: 76 mg/dL (ref 70–99)
Glucose-Capillary: 134 mg/dL — ABNORMAL HIGH (ref 70–99)

## 2020-03-28 LAB — MAGNESIUM: Magnesium: 1.9 mg/dL (ref 1.7–2.4)

## 2020-03-28 LAB — PROTIME-INR
INR: 1.2 (ref 0.8–1.2)
Prothrombin Time: 14.8 seconds (ref 11.4–15.2)

## 2020-03-28 MED ORDER — METFORMIN HCL 500 MG PO TABS
500.0000 mg | ORAL_TABLET | Freq: Two times a day (BID) | ORAL | Status: DC
  Administered 2020-03-28: 500 mg via ORAL
  Filled 2020-03-28: qty 1

## 2020-03-28 MED ORDER — DILTIAZEM HCL ER COATED BEADS 240 MG PO CP24
240.0000 mg | ORAL_CAPSULE | Freq: Every day | ORAL | Status: DC
  Administered 2020-03-28: 240 mg via ORAL
  Filled 2020-03-28: qty 1

## 2020-03-28 MED ORDER — CEPHALEXIN 500 MG PO CAPS: 500.0000 mg | ORAL_CAPSULE | Freq: Four times a day (QID) | ORAL | 0 refills | Status: AC

## 2020-03-28 MED ORDER — DILTIAZEM HCL ER COATED BEADS 240 MG PO CP24: 240.0000 mg | ORAL_CAPSULE | Freq: Every day | ORAL | 3 refills | Status: DC

## 2020-03-28 MED ORDER — METFORMIN HCL 500 MG PO TABS: 500.0000 mg | ORAL_TABLET | Freq: Two times a day (BID) | ORAL | Status: DC

## 2020-03-28 MED FILL — CEPHALEXIN 500 MG CAPSULE: 500 | 5 days supply | Qty: 20 | Fill #0

## 2020-03-28 MED FILL — DILTIAZEM 24HR ER 240 MG CA: 240 | 30 days supply | Qty: 30 | Fill #0

## 2020-03-28 NOTE — TOC Transition Note (Signed)
Transition of Care [facility]) - CM/SW Discharge Note   Patient Details  Name: Casey Bell MRN: [id number] Date of Birth: 1956-06-07  Transition of Care [facility]) CM/SW Contact:  Lanier Clam, RN Phone Number: 03/28/2020, 11:56 AM   Clinical Narrative: Patient received all info for shelter, [facility] referral given.Informed nsg-if rw needed would need to contact attending for order, and no PT recc for rw..No further CM needs.      Final next level of care: Homeless Shelter Barriers to Discharge: No Barriers Identified   Patient Goals and CMS Choice Patient states their goals for this hospitalization and ongoing recovery are:: go to shelter CMS Medicare.gov Compare Post Acute Care list provided to:: Patient Choice offered to / list presented to : Patient  Discharge Placement                       Discharge Plan and Services   Discharge Planning Services: CM Consult Post Acute Care Choice: NA                               Social Determinants of Health (SDOH) Interventions SDOH Interventions for the Following Domains: Housing, Surveyor, quantity Strain (cannibus use) Financial Strain Interventions: [id number] Referral Housing Interventions: [id number] Referral   Readmission Risk Interventions No flowsheet data found.

## 2020-03-28 NOTE — Progress Notes (Signed)
Nurse Tech checked am CBG and pt blood sugar was 68. Hypoglycemic standing order started. PT given orange juice, which he drank all off. Will recheck CBG after 15 minutes. Pt states he feels ok. Will continue to monitor.

## 2020-03-28 NOTE — Discharge Summary (Signed)
Physician Discharge Summary  Casey Bell LEX:[id number] DOB: Aug 25, 1955 DOA: 03/26/2020  PCP: Kerin Perna, NP  Admit date: 03/26/2020 Discharge date: 03/28/2020  Time spent: 20 minutes  Recommendations for Outpatient Follow-up:  1. New medication Cardizem 240 2. Decreased his dose of Metformin this admission 3. Secure message sent to Gay Filler of see [facility] who will see the patient in consult and discuss further options in terms of event monitor-not a candidate for anticoagulation at this juncture 4. Outpatient dermatologist as able  Discharge Diagnoses:  Active Problems:   Cellulitis of leg, left   Psoriasis   Atrial fibrillation with RVR (HCC)   Elevated LFTs   Discharge Condition: Improved  Diet recommendation: Heart healthy diabetic  Filed Weights   03/26/20 1257 03/26/20 2125  Weight: 90.7 kg 95 kg    History of present illness:  64 year old black male DM TY 2 Prior smoker quit 2010 Psoriasis has not seen a dermatologist (has had several ED visits in 2019 for this but cannot establish care) He was seen recently to establish care 03/23/2020-was prescribed Atarax clobetasolcream and ointment as well as shampoo but did not pick up any of these prescriptions as yet (expense may preclude obtaining the same Found to have left leg pain and bleeding and also found to have new onset A. fib with RVR and was admitted for the same Had no further recurrence of the atrial fibrillation during hospital stay and was transitioned to consolidated dosing of Cardizem on discharge did not require anticoagulation but may need PCP follow-up He was ambulated in the hallway heart rate went up to the 90s but no higher and did not have recurrence of his paroxysmal A. fib It is suspected that he drinks more than he lets on and this may be a part of why he may have "holiday heart" and he will need absolute cessation which was briefly discussed with him and should be followed up in  the outpatient setting by his primary nurse practitioner In addition he needs to pick up his medications for his psoriasis and he has been counseled to continue the Keflex that was called in I would given him a prescription for all these medications and instructed him to follow-up with [email or web address] so that he can abilities more cheaply if possible He had many questions about housing which we are not able to help him with unfortunately and he should if able get an event monitor for paroxysmal A. fib to ensure that there are no recurrences He had met maximal hospital benefit during his stay and was discharged in stable state home  Discharge Exam: Vitals:   03/28/20 0400 03/28/20 0908  BP: 132/78 134/88  Pulse: 75   Resp: 17   Temp: 98.2 F (36.8 C)   SpO2: 91%     General: Awake alert coherent no distress EOMI NCAT no focal deficit walking in the hallway without any problems Cardiovascular: S1-S2 no murmur rub or gallop sinus rhythm on monitors Respiratory: Clear no added sound no rales no rhonchi Abdomen soft no rebound no guarding  Discharge Instructions   Discharge Instructions    Diet - low sodium heart healthy   Complete by: As directed    Discharge instructions   Complete by: As directed    As discussed you had a irregular heartbeat on one-time evaluation which resolved and this needs to be followed in the outpatient setting Ideally you should have an event monitor but this will require that you have a  fixed place of stay-I will try to set you up with an event monitor which helps to monitors this is recurring and this needs to be set on your chest for about 30 days-if you are able to have once again a stable place of abode we will see if cardiology can call you and set this up You will have a new medication called Cardizem which will help control your heart rate but you do not need anything else other than antibiotics for your lower extremities and continued steroids and creams    Increase activity slowly   Complete by: As directed      Allergies as of 03/28/2020   No Known Allergies     Medication List    STOP taking these medications   lisinopril 10 MG tablet Commonly known as: ZESTRIL   naproxen sodium 220 MG tablet Commonly known as: ALEVE     TAKE these medications   acetaminophen 500 MG tablet Commonly known as: TYLENOL Take 500 mg by mouth every 6 (six) hours as needed for moderate pain.   cephALEXin 500 MG capsule Commonly known as: KEFLEX Take 1 capsule (500 mg total) by mouth every 6 (six) hours for 5 days.   Clobetasol Propionate 0.05 % lotion Apply 118 mLs topically 2 (two) times daily.   Clobetasol Propionate 0.05 % shampoo Apply 0.5 mLs topically 2 (two) times daily.   diltiazem 240 MG 24 hr capsule Commonly known as: CARDIZEM CD Take 1 capsule (240 mg total) by mouth daily.   hydrOXYzine 10 MG tablet Commonly known as: ATARAX/VISTARIL Take 1 tablet (10 mg total) by mouth 3 (three) times daily as needed. What changed: reasons to take this   Lubriderm Lotn Apply 15 application topically in the morning and at bedtime.   metFORMIN 500 MG tablet Commonly known as: GLUCOPHAGE Take 1 tablet (500 mg total) by mouth 2 (two) times daily with a meal. What changed:   medication strength  how much to take   triamcinolone cream 0.1 % Commonly known as: KENALOG Apply 1 application topically 2 (two) times daily. Use 5 to 10 cc, with each application, from the neck to the toes.  Apply this with Lubriderm to extend the treatment to a bigger part of your body.      No Known Allergies  Follow-up Information    [facility]. Schedule an appointment as soon as possible for a visit.   Specialty: Family Medicine Contact information: [place] [phone] [place]. Go to.   Why: [id number] Contact information: [address]                The results of significant diagnostics from this hospitalization (including imaging, microbiology, ancillary and laboratory) are listed below for reference.    Significant Diagnostic Studies: US Abdomen Complete  Result Date: 03/27/2020 CLINICAL DATA:  Elevated liver enzymes EXAM: ABDOMEN ULTRASOUND COMPLETE COMPARISON:  None. FINDINGS: Gallbladder: No gallstones are evident. There is thickening of the gallbladder wall with questionable mild edema within the wall. No pericholecystic fluid evident. No sonographic Murphy sign noted by sonographer. Common bile duct: Diameter: 3 mm. No intrahepatic, common hepatic, or common bile duct dilatation. Liver: No focal lesion identified. Within normal limits in parenchymal echogenicity. Portal vein is patent on color Doppler imaging with normal direction of blood flow towards the liver. IVC: No abnormality visualized. Pancreas: There is no appreciable pancreatic  mass or inflammatory focus. Spleen: Size and appearance within normal limits. Right Kidney: Length: 10.0 cm. Echogenicity within normal limits. No mass or hydronephrosis visualized. Left Kidney: Length: 10.9 cm. Echogenicity within normal limits. No mass or hydronephrosis visualized. Abdominal aorta: No aneurysm visualized. Other findings: Pleural effusions are noted bilaterally. There is slight ascites. IMPRESSION: 1.  Slight ascites.  Pleural effusions bilaterally. 2. Thickened gallbladder wall noted. No gallstones evident. Gallbladder wall thickening may be seen with ascites. Potential degree of acalculus cholecystitis must be of concern in this circumstance, however. In this regard, correlation with nuclear medicine hepatobiliary imaging study to assess for cystic duct patency may be advisable. 3.  Study otherwise unremarkable. Electronically Signed   By: Lowella Grip III M.D.   On: 03/27/2020 08:40    Microbiology: Recent Results (from the past 240 hour(s))  Culture,  blood (Routine X 2) w Reflex to ID Panel     Status: None (Preliminary result)   Collection Time: 03/26/20  1:28 PM   Specimen: BLOOD  Result Value Ref Range Status   Specimen Description   Final    BLOOD LEFT ANTECUBITAL Performed at [facility], [place] [address], [place], [place] [address]    Special Requests   Final    BOTTLES DRAWN AEROBIC AND ANAEROBIC Blood Culture adequate volume Performed at [place] [address], [place], [place] [address]    Culture   Final    NO GROWTH 2 DAYS Performed at [place] [address] [place] [address] [place], [place] [address]    Report Status PENDING  Incomplete  SARS Coronavirus 2 by RT PCR (hospital order, performed in [facility] hospital lab) Nasopharyngeal Nasopharyngeal Swab     Status: None   Collection Time: 03/26/20  5:44 PM   Specimen: Nasopharyngeal Swab  Result Value Ref Range Status   SARS Coronavirus 2 NEGATIVE NEGATIVE Final    Comment: (NOTE) SARS-CoV-2 target nucleic acids are NOT DETECTED.  The SARS-CoV-2 RNA is generally detectable in upper and lower respiratory specimens during the acute phase of infection. The lowest concentration of SARS-CoV-2 viral copies this assay can detect is 250 copies / mL. A negative result does not preclude SARS-CoV-2 infection and should not be used as the sole basis for treatment or other patient management decisions.  A negative result may occur with improper specimen collection / handling, submission of specimen other than nasopharyngeal swab, presence of viral mutation(s) within the areas targeted by this assay, and inadequate number of viral copies (<250 copies / mL). A negative result must be combined with clinical observations, patient history, and epidemiological information.  Fact Sheet for Patients:   StrictlyIdeas.no  Fact Sheet for Healthcare Providers: BankingDealers.co.za  This test is not yet  approved or  cleared by the [place] FDA and has been authorized for detection and/or diagnosis of SARS-CoV-2 by FDA under an Emergency Use Authorization (EUA).  This EUA will remain in effect (meaning this test can be used) for the duration of the COVID-19 declaration under Section 564(b)(1) of the Act, 21 U.S.C. section 360bbb-3(b)(1), unless the authorization is terminated or revoked sooner.  Performed at [facility], [place] [address], [place], [place] [address]      Labs: Basic Metabolic Panel: Recent Labs  Lab 03/26/20 1328 03/28/20 0601  NA 138 136  K 3.9 4.4  CL 107 109  CO2 21* 17*  GLUCOSE 156* 89  BUN 12 14  CREATININE 1.12 1.07  CALCIUM 8.6*  8.4*  MG  --  1.9   Liver Function Tests: Recent Labs  Lab 03/26/20 1328 03/28/20 0601  AST 46* 45*  ALT 26 28  ALKPHOS 134* 106  BILITOT 1.4* 1.5*  PROT 7.8 7.2  ALBUMIN 3.3* 3.0*   No results for input(s): LIPASE, AMYLASE in the last 168 hours. No results for input(s): AMMONIA in the last 168 hours. CBC: Recent Labs  Lab 03/26/20 1328 03/27/20 0513 03/28/20 0601  WBC 5.0 5.7 5.0  NEUTROABS  --   --  3.1  HGB 13.5 13.6 13.5  HCT 42.0 44.2 41.8  MCV 89.6 93.1 88.6  PLT 197 168 189   Cardiac Enzymes: No results for input(s): CKTOTAL, CKMB, CKMBINDEX, TROPONINI in the last 168 hours. BNP: BNP (last 3 results) No results for input(s): BNP in the last 8760 hours.  ProBNP (last 3 results) No results for input(s): PROBNP in the last 8760 hours.  CBG: Recent Labs  Lab 03/27/20 1134 03/27/20 1618 03/27/20 2247 03/28/20 0746 03/28/20 0805  GLUCAP 98 105* 95 68* 76       Signed:  Nita Sells MD   [facility] 03/28/2020, 11:05 AM

## 2020-03-28 NOTE — Plan of Care (Signed)

## 2020-03-29 ENCOUNTER — Telehealth: Payer: Self-pay

## 2020-03-29 NOTE — Telephone Encounter (Signed)
Transition Care Management Follow-up Telephone Call Date of discharge and from where:03/28/2020, [facility].  Call placed to patient # [id number], message left with call back requested to this CM.# [phone].  Need to complete TOC call and he needs to schedule follow up with Ms Randa Evens, NP

## 2020-03-30 ENCOUNTER — Telehealth: Payer: Self-pay
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
## 2020-03-31 LAB — CULTURE, BLOOD (ROUTINE X 2)
Culture: NO GROWTH
Special Requests: ADEQUATE

## 2020-04-03 ENCOUNTER — Telehealth: Payer: Self-pay

## 2020-04-03 ENCOUNTER — Telehealth: Payer: Self-pay | Admitting: Primary Care

## 2020-04-03 NOTE — Telephone Encounter (Signed)
Copied from CRM [id number]. Topic: General - Other >> Apr 03, 2020  9:30 AM Angela Nevin wrote: Patient returning call to [facility], requesting call back.

## 2020-04-04 ENCOUNTER — Ambulatory Visit (INDEPENDENT_AMBULATORY_CARE_PROVIDER_SITE_OTHER): Payer: Self-pay | Admitting: Family Medicine

## 2020-04-04 ENCOUNTER — Encounter (INDEPENDENT_AMBULATORY_CARE_PROVIDER_SITE_OTHER): Payer: Self-pay | Admitting: Family Medicine

## 2020-04-04 ENCOUNTER — Other Ambulatory Visit: Payer: Self-pay

## 2020-04-04 VITALS — BP 152/98 | HR 107 | Wt 210.4 lb

## 2020-04-04 DIAGNOSIS — R0609 Other forms of dyspnea: Secondary | ICD-10-CM

## 2020-04-04 DIAGNOSIS — I4891 Unspecified atrial fibrillation: Secondary | ICD-10-CM

## 2020-04-04 DIAGNOSIS — E119 Type 2 diabetes mellitus without complications: Secondary | ICD-10-CM | POA: Insufficient documentation

## 2020-04-04 DIAGNOSIS — I10 Essential (primary) hypertension: Secondary | ICD-10-CM

## 2020-04-04 DIAGNOSIS — L03116 Cellulitis of left lower limb: Secondary | ICD-10-CM

## 2020-04-04 DIAGNOSIS — R600 Localized edema: Secondary | ICD-10-CM

## 2020-04-04 DIAGNOSIS — E1159 Type 2 diabetes mellitus with other circulatory complications: Secondary | ICD-10-CM

## 2020-04-04 DIAGNOSIS — L409 Psoriasis, unspecified: Secondary | ICD-10-CM

## 2020-04-04 MED ORDER — FUROSEMIDE 20 MG PO TABS: 20.0000 mg | ORAL_TABLET | Freq: Every day | ORAL | 0 refills | Status: DC

## 2020-04-04 NOTE — Progress Notes (Signed)
Subjective:  Patient ID: Casey Bell, male    DOB: [birth year]  Age: 65 y.o. MRN: [id number]  CC: Hospitalization Follow-up   HPI Casey Bell is a 64 year old male with a history of hypertension, psoriasis who presents for a transitional care visit after hospitalization at [facility] long hospital after hospitalization from 03/26/2020 through 03/28/2020 for left lower extremity cellulitis and found to have new onset A. fib with RVR. Cardizem was initiated, lisinopril discontinued but there was no decision to commence anticoagulation per notes.  Recommendation was for outpatient evaluation for possible event monitor. Placed on Keflex for his cellulitis and subsequently discharged.  Complains of b/l pedal edema for the last few weeks; L>R and he attributes this to occurring just before hospitalization.  Completed his course of antibiotics. Complains of insomnia which started post hospital discharge which he attributes to wheezing and this was not present prior to his hospitalization. He gets dyspneic on mild exertion.  Denies presence of palpitations, irregular heartbeat or chest pains.  He does not have an appointment with cardiology.  Past Medical History:  Diagnosis Date  . Diabetes mellitus without complication (HCC)   . Psoriasis .  [name] Psoriasis     History reviewed. No pertinent surgical history.  Family History  Problem Relation Age of Onset  . Chronic Renal Failure Mother   . Heart failure Mother     No Known Allergies  Outpatient Medications Prior to Visit  Medication Sig Dispense Refill  . acetaminophen (TYLENOL) 500 MG tablet Take 500 mg by mouth every 6 (six) hours as needed for moderate pain.    . Clobetasol Propionate 0.05 % lotion Apply 118 mLs topically 2 (two) times daily. 59 mL 1  . diltiazem (CARDIZEM CD) 240 MG 24 hr capsule Take 1 capsule (240 mg total) by mouth daily. 30 capsule 3  . Emollient (LUBRIDERM) LOTN Apply 15 application topically in the morning  and at bedtime. 240 mL 0  . hydrOXYzine (ATARAX/VISTARIL) 10 MG tablet Take 1 tablet (10 mg total) by mouth 3 (three) times daily as needed. (Patient taking differently: Take 10 mg by mouth 3 (three) times daily as needed for itching. ) 90 tablet 0  . metFORMIN (GLUCOPHAGE) 500 MG tablet Take 1 tablet (500 mg total) by mouth 2 (two) times daily with a meal.    . triamcinolone cream (KENALOG) 0.1 % Apply 1 application topically 2 (two) times daily. Use 5 to 10 cc, with each application, from the neck to the toes.  Apply this with Lubriderm to extend the treatment to a bigger part of your body. 120 g 0  . Clobetasol Propionate 0.05 % shampoo Apply 0.5 mLs topically 2 (two) times daily. (Patient not taking: Reported on 04/04/2020) 118 mL 2   No facility-administered medications prior to visit.     ROS Review of Systems  Constitutional: Negative for activity change and appetite change.  HENT: Negative for sinus pressure and sore throat.   Eyes: Negative for visual disturbance.  Respiratory: Positive for shortness of breath and wheezing. Negative for cough and chest tightness.   Cardiovascular: Positive for leg swelling. Negative for chest pain.  Gastrointestinal: Negative for abdominal distention, abdominal pain, constipation and diarrhea.  Endocrine: Negative.   Genitourinary: Negative for dysuria.  Musculoskeletal: Negative for joint swelling and myalgias.  Skin: Positive for rash.  Allergic/Immunologic: Negative.   Neurological: Negative for weakness, light-headedness and numbness.  Psychiatric/Behavioral: Positive for sleep disturbance. Negative for dysphoric mood and suicidal ideas.  Objective:  BP (!) 152/98   Pulse (!) 107   Wt 210 lb 6.4 oz (95.4 kg)   SpO2 96%   BMI 29.34 kg/m   BP/Weight 04/04/2020 03/28/2020 03/26/2020  Systolic BP 152 134 -  Diastolic BP 98 88 -  Wt. (Lbs) 210.4 - 209.44  BMI 29.34 - 29.21      Physical Exam Constitutional:      Appearance: He is  well-developed.  Neck:     Vascular: No JVD.  Cardiovascular:     Rate and Rhythm: Tachycardia present.     Heart sounds: Normal heart sounds. No murmur heard.   Pulmonary:     Effort: Pulmonary effort is normal.     Breath sounds: Normal breath sounds. No wheezing or rales.  Chest:     Chest wall: No tenderness.  Abdominal:     General: Bowel sounds are normal. There is no distension.     Palpations: Abdomen is soft. There is no mass.     Tenderness: There is no abdominal tenderness.  Musculoskeletal:        General: Normal range of motion.     Right lower leg: Edema (1+) present.     Left lower leg: Edema (1+) present.  Skin:    Comments: Psoriatic rash diffusely distributed on entire skin  Neurological:     Mental Status: He is alert and oriented to person, place, and time.  Psychiatric:        Mood and Affect: Mood normal.     CMP Latest Ref Rng & Units 03/28/2020 03/26/2020 01/27/2020  Glucose 70 - 99 mg/dL 89 474(Q) 595(G)  BUN 8 - 23 mg/dL 14 12 11   Creatinine 0.61 - 1.24 mg/dL 3.87 5.64  Sodium 135 - 145 mmol/L 136 138 142  Potassium 3.5 - 5.1 mmol/L 4.4 3.9 4.1  Chloride 98 - 111 mmol/L 109 107 105  CO2 22 - 32 mmol/L 17(L) 21(L) 22  Calcium 8.9 - 10.3 mg/dL 3.32) 9.5(J) 8.8(C)  Total Protein 6.5 - 8.1 g/dL 7.2 7.8 7.5  Total Bilirubin 0.3 - 1.2 mg/dL 1.6(S) 0.6(T) 0.6  Alkaline Phos 38 - 126 U/L 106 134(H) 94  AST 15 - 41 U/L 45(H) 46(H) 42(H)  ALT 0 - 44 U/L 28 26 24     Lipid Panel     Component Value Date/Time   CHOL 205 (H) 04/02/2010 1949   TRIG 218 (H) 04/02/2010 1949   HDL 42 04/02/2010 1949   CHOLHDL 4.9 Ratio 04/02/2010 1949   VLDL 44 (H) 04/02/2010 1949   LDLCALC 119 (H) 04/02/2010 1949    CBC    Component Value Date/Time   WBC 5.0 03/28/2020 0601   RBC 4.72 03/28/2020 0601   HGB 13.5 03/28/2020 0601   HCT 41.8 03/28/2020 0601   PLT 189 03/28/2020 0601   MCV 88.6 03/28/2020 0601   MCH 28.6 03/28/2020 0601   MCHC 32.3 03/28/2020  0601   RDW 15.8 (H) 03/28/2020 0601   LYMPHSABS 1.3 03/28/2020 0601   MONOABS 0.5 03/28/2020 0601   EOSABS 0.1 03/28/2020 0601   BASOSABS 0.0 03/28/2020 0601    Lab Results  Component Value Date   HGBA1C 5.7 (A) 03/23/2020    Assessment & Plan:  1. Atrial fibrillation with RVR (HCC) Rate controlled on Cardizem Currently not on anticoagulation We will refer to cardiology for optimization and evaluation for possible event monitor - Ambulatory referral to Cardiology - ECHOCARDIOGRAM COMPLETE; Future  2. Cellulitis of leg, left Completed course  of Keflex He does have residual pedal edema No indication for another course of antibiotics  3. Pedal edema Advised to elevate feet, reduce sodium intake, use compression stockings Short course of Lasix - furosemide (LASIX) 20 MG tablet; Take 1 tablet (20 mg total) by mouth daily.  Dispense: 7 tablet; Refill: 0 - Brain natriuretic peptide - ECHOCARDIOGRAM COMPLETE; Future  4. Other form of dyspnea We will need to exclude cardiac etiology - Brain natriuretic peptide - Ambulatory referral to Cardiology - ECHOCARDIOGRAM COMPLETE; Future  5. Psoriasis Currently using topical steroids We will need to see dermatology  6. Type 2 diabetes mellitus with other circulatory complication, without long-term current use of insulin (HCC) Controlled with A1c of 5.7 Continue Metformin  7. Essential hypertension Uncontrolled He is currently on Cardizem Blood pressure at his last visit was normal Current addition of Lasix might bring about some improvement If blood pressure remains elevated he will need a second antihypertensive Counseled on blood pressure goal of less than 130/80, low-sodium, DASH diet, medication compliance, 150 minutes of moderate intensity exercise per week. Discussed medication compliance, adverse effects.   Meds ordered this encounter  Medications  . furosemide (LASIX) 20 MG tablet    Sig: Take 1 tablet (20 mg total)  by mouth daily.    Dispense:  7 tablet    Refill:  0    Follow-up: Return in about 1 month (around 05/05/2020) for coordination of care.       Hoy Register, MD, FAAFP. [facility] [place], [place] [phone]   04/04/2020, 11:42 AM

## 2020-04-04 NOTE — Progress Notes (Signed)
States that he is having pain and swelling in legs and having SOB.   States that he cant sleep at night.

## 2020-04-04 NOTE — Patient Instructions (Signed)
Edema  Edema is when you have too much fluid in your body or under your skin. Edema may make your legs, feet, and ankles swell up. Swelling is also common in looser tissues, like around your eyes. This is a common condition. It gets more common as you get older. There are many possible causes of edema. Eating too much salt (sodium) and being on your feet or sitting for a long time can cause edema in your legs, feet, and ankles. Hot weather may make edema worse. Edema is usually painless. Your skin may look swollen or shiny. Follow these instructions at home:  Keep the swollen body part raised (elevated) above the level of your heart when you are sitting or lying down.  Do not sit still or stand for a long time.  Do not wear tight clothes. Do not wear garters on your upper legs.  Exercise your legs. This can help the swelling go down.  Wear elastic bandages or support stockings as told by your doctor.  Eat a low-salt (low-sodium) diet to reduce fluid as told by your doctor.  Depending on the cause of your swelling, you may need to limit how much fluid you drink (fluid restriction).  Take over-the-counter and prescription medicines only as told by your doctor. Contact a doctor if:  Treatment is not working.  You have heart, liver, or kidney disease and have symptoms of edema.  You have sudden and unexplained weight gain. Get help right away if:  You have shortness of breath or chest pain.  You cannot breathe when you lie down.  You have pain, redness, or warmth in the swollen areas.  You have heart, liver, or kidney disease and get edema all of a sudden.  You have a fever and your symptoms get worse all of a sudden. Summary  Edema is when you have too much fluid in your body or under your skin.  Edema may make your legs, feet, and ankles swell up. Swelling is also common in looser tissues, like around your eyes.  Raise (elevate) the swollen body part above the level of your  heart when you are sitting or lying down.  Follow your doctor's instructions about diet and how much fluid you can drink (fluid restriction). This information is not intended to replace advice given to you by your health care provider. Make sure you discuss any questions you have with your health care provider. Document Revised: 08/01/2017 Document Reviewed: 08/16/2016 Elsevier Patient Education  2020 Elsevier Inc.  

## 2020-04-05 LAB — BRAIN NATRIURETIC PEPTIDE: BNP: 1875.4 pg/mL — ABNORMAL HIGH (ref 0.0–100.0)

## 2020-04-06 ENCOUNTER — Other Ambulatory Visit: Payer: Self-pay

## 2020-04-06 ENCOUNTER — Other Ambulatory Visit: Payer: Self-pay | Admitting: Family Medicine

## 2020-04-06 ENCOUNTER — Ambulatory Visit (HOSPITAL_COMMUNITY)
Admission: RE | Admit: 2020-04-06 | Discharge: 2020-04-06 | Disposition: A | Discharge status: Home / Self Care | Payer: Self-pay | Source: Ambulatory Visit | Attending: Family Medicine | Admitting: Family Medicine

## 2020-04-06 DIAGNOSIS — R600 Localized edema: Secondary | ICD-10-CM

## 2020-04-06 DIAGNOSIS — Z87891 Personal history of nicotine dependence: Secondary | ICD-10-CM | POA: Insufficient documentation

## 2020-04-06 DIAGNOSIS — E119 Type 2 diabetes mellitus without complications: Secondary | ICD-10-CM | POA: Insufficient documentation

## 2020-04-06 DIAGNOSIS — I340 Nonrheumatic mitral (valve) insufficiency: Secondary | ICD-10-CM | POA: Insufficient documentation

## 2020-04-06 DIAGNOSIS — R0609 Other forms of dyspnea: Secondary | ICD-10-CM | POA: Insufficient documentation

## 2020-04-06 DIAGNOSIS — I4891 Unspecified atrial fibrillation: Secondary | ICD-10-CM | POA: Insufficient documentation

## 2020-04-06 LAB — ECHOCARDIOGRAM COMPLETE
MV M vel: 4.9 m/s
MV Peak grad: 95.8 mmHg
S' Lateral: 4.9 cm

## 2020-04-06 MED ORDER — FUROSEMIDE 40 MG PO TABS: 40.0000 mg | ORAL_TABLET | Freq: Every day | ORAL | 1 refills | Status: DC

## 2020-04-06 MED ORDER — POTASSIUM CHLORIDE ER 10 MEQ PO TBCR: 10.0000 meq | EXTENDED_RELEASE_TABLET | Freq: Every day | ORAL | 1 refills | Status: DC

## 2020-04-06 MED FILL — FUROSEMIDE 40 MG TAB: 40 | 30 days supply | Qty: 30 | Fill #0

## 2020-04-06 MED FILL — POTASSIUM CHLORIDE ER 10 ME: 10 | 30 days supply | Qty: 30 | Fill #0

## 2020-04-06 NOTE — Progress Notes (Signed)
  Echocardiogram 2D Echocardiogram has been performed.  Casey Bell 04/06/2020, 10:52 AM

## 2020-04-07 ENCOUNTER — Telehealth: Payer: Self-pay

## 2020-04-07 NOTE — Telephone Encounter (Signed)
-----   Message from Hoy Register, MD sent at 04/07/2020  8:20 AM EDT ----- Echocardiogram revealed evidence of congestive heart failure for which I sent Lasix to his pharmacy and he needs to keep appointment with cardiology.

## 2020-04-07 NOTE — Telephone Encounter (Signed)
Patient was called and a voicemail was left informing patient to return phone call for lab results. 

## 2020-04-07 NOTE — Telephone Encounter (Signed)
-----   Message from Enobong Newlin, MD sent at 04/06/2020  2:09 PM EDT ----- Blood work and echocardiogram findings are in keeping with congestive heart failure and could explain his shortness of breath and pedal edema.  I have increase Lasix to 40 mg with additional refills sent to his pharmacy along with potassium as Lasix can cause low potassium levels and he needs to keep his appointment with cardiology 

## 2020-04-10 ENCOUNTER — Inpatient Hospital Stay (INDEPENDENT_AMBULATORY_CARE_PROVIDER_SITE_OTHER): Payer: Self-pay | Admitting: Primary Care

## 2020-04-10 ENCOUNTER — Telehealth: Payer: Self-pay

## 2020-04-10 NOTE — Telephone Encounter (Signed)
-----   Message from Hoy Register, MD sent at 04/06/2020  2:09 PM EDT ----- Blood work and echocardiogram findings are in keeping with congestive heart failure and could explain his shortness of breath and pedal edema.  I have increase Lasix to 40 mg with additional refills sent to his pharmacy along with potassium as Lasix can cause low potassium levels and he needs to keep his appointment with cardiology

## 2020-04-10 NOTE — Telephone Encounter (Signed)
-----   Message from Enobong Newlin, MD sent at 04/07/2020  8:20 AM EDT ----- Echocardiogram revealed evidence of congestive heart failure for which I sent Lasix to his pharmacy and he needs to keep appointment with cardiology. 

## 2020-04-10 NOTE — Telephone Encounter (Signed)
Patient has viewed results on mychart. °

## 2020-04-12 ENCOUNTER — Telehealth (INDEPENDENT_AMBULATORY_CARE_PROVIDER_SITE_OTHER): Payer: Self-pay | Admitting: Primary Care

## 2020-04-12 NOTE — Telephone Encounter (Signed)
[name] is calling [facility] about, he says a HFU visit that he had with Dr Alvis Lemmings. He is Casey Bell pt at [facility]. He states has gotten a cb about heart med from [facility] wants a FU call at [phone] b/c he does not know how much or if to take med

## 2020-04-13 MED FILL — FUROSEMIDE 40 MG TAB: 40 | 30 days supply | Qty: 30 | Fill #0

## 2020-04-13 MED FILL — POTASSIUM CHLORIDE ER 10 ME: 10 | 30 days supply | Qty: 30 | Fill #0

## 2020-04-13 NOTE — Telephone Encounter (Signed)
Opened in error

## 2020-04-18 ENCOUNTER — Telehealth: Payer: Self-pay

## 2020-04-18 NOTE — Telephone Encounter (Signed)
Pt. Called back to verify labs from 04/04/20. Verbalizes understanding.

## 2020-04-18 NOTE — Telephone Encounter (Signed)
Please advise on which medication is for heart and if and how patient should take it.

## 2020-04-24 MED FILL — DILTIAZEM 24HR ER 240 MG CA: 240 | 30 days supply | Qty: 30 | Fill #1

## 2020-04-27 ENCOUNTER — Encounter: Payer: Self-pay | Admitting: Internal Medicine

## 2020-04-27 ENCOUNTER — Ambulatory Visit (INDEPENDENT_AMBULATORY_CARE_PROVIDER_SITE_OTHER): Payer: Self-pay | Admitting: Internal Medicine

## 2020-04-27 ENCOUNTER — Other Ambulatory Visit: Payer: Self-pay

## 2020-04-27 ENCOUNTER — Other Ambulatory Visit: Payer: Self-pay | Admitting: Internal Medicine

## 2020-04-27 VITALS — BP 100/60 | HR 69 | Ht 71.0 in | Wt 180.0 lb

## 2020-04-27 DIAGNOSIS — I483 Typical atrial flutter: Secondary | ICD-10-CM

## 2020-04-27 DIAGNOSIS — I5042 Chronic combined systolic (congestive) and diastolic (congestive) heart failure: Secondary | ICD-10-CM | POA: Insufficient documentation

## 2020-04-27 MED ORDER — METOPROLOL SUCCINATE ER 50 MG PO TB24: 50.0000 mg | ORAL_TABLET | Freq: Every day | ORAL | 3 refills | Status: DC

## 2020-04-27 MED ORDER — LISINOPRIL 2.5 MG PO TABS: 2.5000 mg | ORAL_TABLET | Freq: Every day | ORAL | 3 refills | Status: DC

## 2020-04-27 MED ORDER — APIXABAN 5 MG PO TABS: 5.0000 mg | ORAL_TABLET | Freq: Two times a day (BID) | ORAL | 3 refills | Status: DC

## 2020-04-27 NOTE — Patient Instructions (Signed)
Medication Instructions:  Your physician has recommended you make the following change in your medication:   START: Eliquis 5mg  twice daily START: Metoprolol Succinate 50mg  daily START: Lisinopril 2.5mg  daily STOP: Diltiazem  *If you need a refill on your cardiac medications before your next appointment, please call your pharmacy*   Lab Work: BNP, BMET  If you have labs (blood work) drawn today and your tests are completely normal, you will receive your results only by: MyChart Message (if you have MyChart) OR . A paper copy in the mail If you have any lab test that is abnormal or we need to change your treatment, we will call you to review the results.   Testing/Procedures: Your physician has recommended that you have a sleep study. This test records several body functions during sleep, including: brain activity, eye movement, oxygen and carbon dioxide blood levels, heart rate and rhythm, breathing rate and rhythm, the flow of air through your mouth and nose, snoring, body muscle movements, and chest and belly movement.    Follow-Up: At [facility], you and your health needs are our priority.  As part of our continuing mission to provide you with exceptional heart care, we have created designated Provider Care Teams.  These Care Teams include your primary Cardiologist (physician) and Advanced Practice Providers (APPs -  Physician Assistants and Nurse Practitioners) who all work together to provide you with the care you need, when you need it.  We recommend signing up for the patient portal called "MyChart".  Sign up information is provided on this After Visit Summary.  MyChart is used to connect with patients for Virtual Visits (Telemedicine).  Patients are able to view lab/test results, encounter notes, upcoming appointments, etc.  Non-urgent messages can be sent to your provider as well.   To learn more about what you can do with MyChart, go to [name].    Your  next appointment:   07/27/2020  The format for your next appointment:   In Person  Provider:   [email or web address], MD   Other Instructions Fill out your portion of the Patient Assistance Application and bring all back to our office with the requested documentation. We will complete our portion and fax for you.

## 2020-04-27 NOTE — Progress Notes (Signed)
Cardiology Office Note:    Date:  04/27/2020   ID:  Casey Bell, DOB 12/21/1955, MRN [id number]  PCP:  Casey Sessions, NP  [facility] Cardiologist:  No primary care provider on file.  [facility] Electrophysiologist:  None   Referring MD: Hoy Register, MD   CC: Eval for Atrial flutter Consulted for the evaluation of Atrial Flutter at the behest of Casey Sessions, NP  History of Present Illness:    Casey Bell is a 64 y.o. male with a hx of AFl 2:1, CHADSVASC 2 (DM, HF) who presents for eval of atrial flutter.  Patient feels fine.  Patients has no shortness of breath, PND, DOE, chest pain.  No palpitations.  Went to the hospital 8/17 for psoriasis outbreak.   Patient had incidental atrial flutter.  Patient notes that he has increased fluid in his legs.  With the lasix, he has lost 25 lbs.  Feels a lot better and able to ambulate well.  No syncope.  No bleeding issues.  Never been on blood thinners.  Patient also notes snoring; lady friend notes nocturnal apneic events, day time somnolence, and decrease rest for waking.Drunks a couple of beers every few days.  No other alcohol (liquor or wine).  Past Medical History:  Diagnosis Date  . Diabetes mellitus without complication (HCC)   . Psoriasis .  [name] Psoriasis     No past surgical history on file.  Current Medications: Current Meds  Medication Sig  . acetaminophen (TYLENOL) 500 MG tablet Take 500 mg by mouth every 6 (six) hours as needed for moderate pain.  . Clobetasol Propionate 0.05 % lotion Apply 118 mLs topically 2 (two) times daily.  . Clobetasol Propionate 0.05 % shampoo Apply 0.5 mLs topically 2 (two) times daily.  [name] diltiazem (CARDIZEM CD) 240 MG 24 hr capsule Take 1 capsule (240 mg total) by mouth daily.  . Emollient (LUBRIDERM) LOTN Apply 15 application topically in the morning and at bedtime.  . furosemide (LASIX) 40 MG tablet Take 1 tablet (40 mg total) by mouth daily.  . hydrOXYzine  (ATARAX/VISTARIL) 10 MG tablet Take 1 tablet (10 mg total) by mouth 3 (three) times daily as needed.  . metFORMIN (GLUCOPHAGE) 500 MG tablet Take 1 tablet (500 mg total) by mouth 2 (two) times daily with a meal.  . potassium chloride (KLOR-CON 10) 10 MEQ tablet Take 1 tablet (10 mEq total) by mouth daily.  [name] triamcinolone cream (KENALOG) 0.1 % Apply 1 application topically 2 (two) times daily. Use 5 to 10 cc, with each application, from the neck to the toes.  Apply this with Lubriderm to extend the treatment to a bigger part of your body.     Allergies:   Patient has no known allergies.   Social History   Socioeconomic History  . Marital status: Divorced    Spouse name: Not on file  . Number of children: Not on file  . Years of education: Not on file  . Highest education level: Not on file  Occupational History  . Not on file  Tobacco Use  . Smoking status: Former Smoker    Quit date: 2010    Years since quitting: 11.7  . Smokeless tobacco: Never Used  Substance and Sexual Activity  . Alcohol use: Yes    Comment: few/week  . Drug use: Yes    Types: Marijuana  . Sexual activity: Not Currently  Other Topics Concern  . Not on file  Social History  Narrative  . Not on file   Social Determinants of Health   Financial Resource Strain:   . Difficulty of Paying Living Expenses: Not on file  Food Insecurity:   . Worried About Programme researcher, broadcasting/film/video in the Last Year: Not on file  . Ran Out of Food in the Last Year: Not on file  Transportation Needs:   . Lack of Transportation (Medical): Not on file  . Lack of Transportation (Non-Medical): Not on file  Physical Activity:   . Days of Exercise per Week: Not on file  . Minutes of Exercise per Session: Not on file  Stress:   . Feeling of Stress : Not on file  Social Connections:   . Frequency of Communication with Friends and Family: Not on file  . Frequency of Social Gatherings with Friends and Family: Not on file  . Attends  Religious Services: Not on file  . Active Member of Clubs or Organizations: Not on file  . Attends Banker Meetings: Not on file  . Marital Status: Not on file     Family History: The patient's family history includes Chronic Renal Failure in his mother; Heart failure in his mother. Sister CHF Mother CHF  ROS:   Please see the history of present illness.     All other systems reviewed and are negative.  EKGs/Labs/Other Studies Reviewed:    The following studies were reviewed today:  EKG:  EKG is ordered today.  The ekg ordered today demonstrates atrial flutter with ventricular rate 69  03/27/20:  Atrial flutter with 2:1 conduction rate 135  Recent Labs: 03/28/2020: ALT 28; BUN 14; Creatinine, Ser 1.07; Hemoglobin 13.5; Magnesium 1.9; Platelets 189; Potassium 4.4; Sodium 136 04/04/2020: BNP 1,875.4  Recent Lipid Panel    Component Value Date/Time   CHOL 205 (H) 04/02/2010 1949   TRIG 218 (H) 04/02/2010 1949   HDL 42 04/02/2010 1949   CHOLHDL 4.9 Ratio 04/02/2010 1949   VLDL 44 (H) 04/02/2010 1949   LDLCALC 119 (H) 04/02/2010 1949   TSH 2.35  04/06/20 EF 35% with biventricular dysfunction 1. Left ventricular ejection fraction, by estimation, is 35 to 40%. The  left ventricle has moderately decreased function. The left ventricle  demonstrates regional wall motion abnormalities (see scoring  diagram/findings for description). The left  ventricular internal cavity size was mildly dilated. Left ventricular  diastolic parameters are consistent with Grade III diastolic dysfunction  (restrictive). There is moderate akinesis of the left ventricular, entire  inferior wall.  2. Right ventricular systolic function is moderately reduced. The right  ventricular size is moderately enlarged. There is moderately elevated  pulmonary artery systolic pressure.  3. The mitral valve is normal in structure. Mild to moderate mitral valve  regurgitation. No evidence of mitral  stenosis.  4. The aortic valve is normal in structure. Aortic valve regurgitation is  not visualized. No aortic stenosis is present.   Physical Exam:    VS:  BP 100/60   Pulse 69   Ht 5\' 11"  (1.803 m)   Wt 180 lb (81.6 kg)   BMI 25.10 kg/m     Wt Readings from Last 3 Encounters:  04/27/20 180 lb (81.6 kg)  04/04/20 210 lb 6.4 oz (95.4 kg)  03/26/20 209 lb 7 oz (95 kg)    GEN: Well nourished, well developed in no acute distress HEENT: Normal NECK: No JVD; No carotid bruits LYMPHATICS: No lymphadenopathy CARDIAC: RRR, no murmurs, rubs, gallops RESPIRATORY:  Clear to auscultation without  rales, wheezing or rhonchi  ABDOMEN: Soft, non-tender, non-distended MUSCULOSKELETAL:  No edema; No deformity  SKIN: skin discoloration; skin is warm  NEUROLOGIC:  Alert and oriented x 3 PSYCHIATRIC:  Normal affect   ASSESSMENT:    1. Typical atrial flutter (HCC)   2. Chronic combined systolic and diastolic heart failure (HCC)    PLAN:    In order of problems listed above:  1. Typical Atrial Flutter, OSA  CHADSVASC=2. - discussed alcohol cessation - Call in tomorrow; to see if you can afford eliquis 5 mg BID.  If you cannot, let me know and we will put you in to coumadin - Recommend sleep study - When cost affordable, would recommend TEE/DCCV or DCCV if on [facility] for 21 days.    1. Chronic Systolic Heart Failure Heart Failure  - NYHA class I, Stage B, euvolemic on exam, etiology from tachycardia suspected; no prior ischemic work up - Diuretics: continue lasix 40 mg; will BNP and BMP  - Would recommend daily weights and fluid restriction of < 2 L  - transitioning diltiazem to metoprolol succinate 50 mg  - will start lisinopril 2.5 mg  - Consider outpatient SGLT2i when financial situation improves  SOCIAL DETERMINANTS OF HEALTH: Discussed at length options of anticoagulation and management of HFrEF and AFl in the setting of no insurance; will work to get 30 Eliquis card and see if he  qualifies for orange card.  Has a month supply presently.  Follow up in 3 months (sooner if insurance)  Medication Adjustments/Labs and Tests Ordered: Current medicines are reviewed at length with the patient today.  Concerns regarding medicines are outlined above.  Orders Placed This Encounter  Procedures  . Pro b natriuretic peptide (BNP)  . Basic metabolic panel  . EKG 12-Lead   No orders of the defined types were placed in this encounter.   There are no Patient Instructions on file for this visit.   Signed, Christell Constant, MD  04/27/2020 4:59 PM    [facility]

## 2020-04-28 ENCOUNTER — Telehealth: Payer: Self-pay | Admitting: *Deleted

## 2020-04-28 MED FILL — LISINOPRIL 2.5 MG TABLET: 2.5 | 30 days supply | Qty: 30 | Fill #0

## 2020-04-28 MED FILL — METOPROLOL SUCCINATE ER 50: 50 | 30 days supply | Qty: 30 | Fill #0

## 2020-04-28 NOTE — Telephone Encounter (Signed)
Patient is scheduled for lab study on 06/01/20. Patient understands his sleep study will be done at [facility] sleep lab. Patient understands he will receive a sleep packet in a week or so. Patient understands to call if he does not receive the sleep packet in a timely manner. Patient agrees with treatment and thanked me for call.

## 2020-04-28 NOTE — Telephone Encounter (Signed)
Staff message sent to Casey Bell ok to schedule sleep study since patient is self pay.

## 2020-04-28 NOTE — Telephone Encounter (Signed)
-----   Message from April Garrison, [place] sent at 04/27/2020  5:31 PM EDT ----- Order in for pt to have a split night sleep study. Pt is Self-Pay. Provider: Atlee Abide, MD  Thanks, Cleda Mccreedy, [place]

## 2020-04-28 NOTE — Telephone Encounter (Signed)
-----   Message from Gaynelle Cage, CMA sent at 04/28/2020  9:36 AM EDT ----- Ok to schedule sleep study. Nothing to precert since patient is self pay. ----- Message ----- From: Cleda Mccreedy, CMA Sent: 04/27/2020   5:31 PM EDT To: Reesa Chew, CMA, [facility] Sleep Studies  Order in for pt to have a split night sleep study. Pt is Self-Pay. Provider: Atlee Abide, MD  Thanks, Cleda Mccreedy, [place]

## 2020-05-04 ENCOUNTER — Other Ambulatory Visit: Payer: Self-pay | Admitting: *Deleted

## 2020-05-04 ENCOUNTER — Other Ambulatory Visit: Payer: Self-pay

## 2020-05-04 ENCOUNTER — Telehealth (INDEPENDENT_AMBULATORY_CARE_PROVIDER_SITE_OTHER): Payer: Self-pay | Admitting: Primary Care

## 2020-05-04 DIAGNOSIS — Z79899 Other long term (current) drug therapy: Secondary | ICD-10-CM

## 2020-05-04 DIAGNOSIS — I483 Typical atrial flutter: Secondary | ICD-10-CM

## 2020-05-04 DIAGNOSIS — I5042 Chronic combined systolic (congestive) and diastolic (congestive) heart failure: Secondary | ICD-10-CM

## 2020-05-04 NOTE — Progress Notes (Addendum)
Virtual Visit  I connected with Casey Bell on 05/04/20 at  3:10 PM EDT by telephone and verified that I am speaking with the correct person using two identifiers.   Patient: Home  Provider: Office-Duy Lemming Festus Barren    I discussed the limitations, risks, security and privacy concerns of performing an evaluation and management service by telephone and the availability of in person appointments. I also discussed with the patient that there may be a patient responsible charge related to this service. The patient expressed understanding and agreed to proceed.   History of Present Illness: [name] is a 64 year old male concerned about medication refills explained I was managing diabetes and eczema and the cardiologist is managing everything related to the heart and he would need to call them for refills. Patient understood    Past Medical History:  Diagnosis Date  . Diabetes mellitus without complication (HCC)   . Psoriasis .  [name] Psoriasis    Current Outpatient Medications on File Prior to Visit  Medication Sig Dispense Refill  . acetaminophen (TYLENOL) 500 MG tablet Take 500 mg by mouth every 6 (six) hours as needed for moderate pain.    [name] apixaban (ELIQUIS) 5 MG TABS tablet Take 1 tablet (5 mg total) by mouth 2 (two) times daily. 60 tablet 3  . Clobetasol Propionate 0.05 % lotion Apply 118 mLs topically 2 (two) times daily. 59 mL 1  . Clobetasol Propionate 0.05 % shampoo Apply 0.5 mLs topically 2 (two) times daily. 118 mL 2  . Emollient (LUBRIDERM) LOTN Apply 15 application topically in the morning and at bedtime. 240 mL 0  . hydrOXYzine (ATARAX/VISTARIL) 10 MG tablet Take 1 tablet (10 mg total) by mouth 3 (three) times daily as needed. 90 tablet 0  . lisinopril (ZESTRIL) 2.5 MG tablet Take 1 tablet (2.5 mg total) by mouth daily. 90 tablet 3  . metFORMIN (GLUCOPHAGE) 500 MG tablet Take 1 tablet (500 mg total) by mouth 2 (two) times daily with a meal.    . metoprolol  succinate (TOPROL-XL) 50 MG 24 hr tablet Take 1 tablet (50 mg total) by mouth daily. Take with or immediately following a meal. 90 tablet 3  . potassium chloride (KLOR-CON 10) 10 MEQ tablet Take 1 tablet (10 mEq total) by mouth daily. 30 tablet 1  . triamcinolone cream (KENALOG) 0.1 % Apply 1 application topically 2 (two) times daily. Use 5 to 10 cc, with each application, from the neck to the toes.  Apply this with Lubriderm to extend the treatment to a bigger part of your body. 120 g 0   No current facility-administered medications on file prior to visit.   Observations/Objective: Review of Systems  All other systems reviewed and are negative.   Assessment and Plan: Diagnoses and all orders for this visit:  Medication management Discussed medications what provider would be managing his care.   Follow Up Instructions:    I discussed the assessment and treatment plan with the patient. The patient was provided an opportunity to ask questions and all were answered. The patient agreed with the plan and demonstrated an understanding of the instructions.   The patient was advised to call back or seek an in-person evaluation if the symptoms worsen or if the condition fails to improve as anticipated.  I provided 6 minutes of non-face-to-face time during this encounter.   [name], NP

## 2020-05-05 LAB — BASIC METABOLIC PANEL
BUN/Creatinine Ratio: 9 — ABNORMAL LOW (ref 10–24)
BUN: 8 mg/dL (ref 8–27)
CO2: 24 mmol/L (ref 20–29)
Calcium: 8.7 mg/dL (ref 8.6–10.2)
Chloride: 103 mmol/L (ref 96–106)
Creatinine, Ser: 0.85 mg/dL (ref 0.76–1.27)
GFR calc Af Amer: 108 mL/min/{1.73_m2} (ref >59)
GFR calc non Af Amer: 93 mL/min/{1.73_m2} (ref >59)
Glucose: 200 mg/dL — ABNORMAL HIGH (ref 65–99)
Potassium: 4.4 mmol/L (ref 3.5–5.2)
Sodium: 140 mmol/L (ref 134–144)

## 2020-05-05 LAB — PRO B NATRIURETIC PEPTIDE: NT-Pro BNP: 4955 pg/mL — ABNORMAL HIGH (ref 0–210)

## 2020-05-08 ENCOUNTER — Telehealth: Payer: Self-pay | Admitting: *Deleted

## 2020-05-08 ENCOUNTER — Other Ambulatory Visit: Payer: Self-pay | Admitting: Internal Medicine

## 2020-05-08 DIAGNOSIS — R600 Localized edema: Secondary | ICD-10-CM

## 2020-05-08 DIAGNOSIS — I5042 Chronic combined systolic (congestive) and diastolic (congestive) heart failure: Secondary | ICD-10-CM

## 2020-05-08 MED ORDER — FUROSEMIDE 40 MG PO TABS: 40.0000 mg | ORAL_TABLET | Freq: Two times a day (BID) | ORAL | 2 refills | Status: DC

## 2020-05-08 NOTE — Telephone Encounter (Signed)
-----   Message from Christell Constant, MD sent at 05/05/2020 10:41 AM EDT ----- Results: BNP 4000 on current lasix Plan: Will increase lasix to 40 mg BID and start check BMET in one week  Christell Constant, MD

## 2020-05-08 NOTE — Telephone Encounter (Signed)
Patient notified. Will send prescription for increased dose to [facility] and [facility]  Patient will come in for lab work on October 5,2021.

## 2020-05-09 MED FILL — FUROSEMIDE 40 MG TAB: 40 | 30 days supply | Qty: 60 | Fill #0

## 2020-05-16 ENCOUNTER — Other Ambulatory Visit: Payer: Self-pay | Admitting: *Deleted

## 2020-05-16 ENCOUNTER — Other Ambulatory Visit: Payer: Self-pay

## 2020-05-16 DIAGNOSIS — I5042 Chronic combined systolic (congestive) and diastolic (congestive) heart failure: Secondary | ICD-10-CM

## 2020-05-16 DIAGNOSIS — R600 Localized edema: Secondary | ICD-10-CM

## 2020-05-16 MED FILL — POTASSIUM CHLORIDE ER 10 ME: 10 | 30 days supply | Qty: 30 | Fill #1

## 2020-05-17 LAB — BASIC METABOLIC PANEL
BUN/Creatinine Ratio: 9 — ABNORMAL LOW (ref 10–24)
BUN: 10 mg/dL (ref 8–27)
CO2: 24 mmol/L (ref 20–29)
Calcium: 8.9 mg/dL (ref 8.6–10.2)
Chloride: 98 mmol/L (ref 96–106)
Creatinine, Ser: 1.13 mg/dL (ref 0.76–1.27)
GFR calc Af Amer: 80 mL/min/{1.73_m2} (ref >59)
GFR calc non Af Amer: 69 mL/min/{1.73_m2} (ref >59)
Glucose: 166 mg/dL — ABNORMAL HIGH (ref 65–99)
Potassium: 3.7 mmol/L (ref 3.5–5.2)
Sodium: 139 mmol/L (ref 134–144)

## 2020-05-23 ENCOUNTER — Other Ambulatory Visit: Payer: Self-pay | Admitting: Nurse Practitioner

## 2020-05-23 DIAGNOSIS — R0609 Other forms of dyspnea: Secondary | ICD-10-CM

## 2020-05-23 DIAGNOSIS — I5042 Chronic combined systolic (congestive) and diastolic (congestive) heart failure: Secondary | ICD-10-CM

## 2020-05-23 DIAGNOSIS — R600 Localized edema: Secondary | ICD-10-CM

## 2020-05-23 DIAGNOSIS — I4891 Unspecified atrial fibrillation: Secondary | ICD-10-CM

## 2020-05-25 MED FILL — METOPROLOL SUCCINATE ER 50: 50 | 30 days supply | Qty: 30 | Fill #1

## 2020-05-25 MED FILL — LISINOPRIL 2.5 MG TABLET: 2.5 | 30 days supply | Qty: 30 | Fill #1

## 2020-05-26 ENCOUNTER — Telehealth: Payer: Self-pay | Admitting: *Deleted

## 2020-05-26 DIAGNOSIS — I483 Typical atrial flutter: Secondary | ICD-10-CM

## 2020-05-26 MED ORDER — APIXABAN 5 MG PO TABS: 5.0000 mg | ORAL_TABLET | Freq: Two times a day (BID) | ORAL | 5 refills | Status: DC

## 2020-05-26 MED FILL — !ELIQUIS 5MG TABLET: 5 | 30 days supply | Qty: 60 | Fill #0

## 2020-05-26 NOTE — Telephone Encounter (Signed)
Pt requested a Eliquis 5mg  refill and per 04/27/2020 60 tabs and 3 refills was sent; called the pharmacy and they stated the refill wasn't received as it was on no print mode, so I gave a verbal for Eliquis 5mg  tablet with 60 tabs and 5 refills; take twice a day.   Pt is 64 yrs old, wt-81.6kg, Crea-1.13 on 05/16/2020, and last seen by Dr. 68 on 04/27/2020;dose is appropriate per dosing criteria.

## 2020-06-01 ENCOUNTER — Other Ambulatory Visit: Payer: Self-pay

## 2020-06-01 ENCOUNTER — Ambulatory Visit (HOSPITAL_BASED_OUTPATIENT_CLINIC_OR_DEPARTMENT_OTHER): Payer: Self-pay | Attending: Internal Medicine | Admitting: Cardiology

## 2020-06-01 DIAGNOSIS — G4733 Obstructive sleep apnea (adult) (pediatric): Secondary | ICD-10-CM | POA: Insufficient documentation

## 2020-06-01 DIAGNOSIS — I471 Supraventricular tachycardia: Secondary | ICD-10-CM | POA: Insufficient documentation

## 2020-06-01 DIAGNOSIS — I483 Typical atrial flutter: Secondary | ICD-10-CM

## 2020-06-01 DIAGNOSIS — I493 Ventricular premature depolarization: Secondary | ICD-10-CM | POA: Insufficient documentation

## 2020-06-02 ENCOUNTER — Telehealth: Payer: Self-pay | Admitting: *Deleted

## 2020-06-02 DIAGNOSIS — I4891 Unspecified atrial fibrillation: Secondary | ICD-10-CM

## 2020-06-02 NOTE — Procedures (Signed)
   Patient Name: Casey Bell, Casey Bell Date: 06/01/2020 Gender: Male D.O.B: Nov 13, 1955 Age (years): 19 Referring Provider: Riley Lam Height (inches): 71 Interpreting Physician: Armanda Magic MD, ABSM Weight (lbs): 182 RPSGT: Cherylann Parr BMI: 25 MRN: [id number] Neck Size: 17.00  CLINICAL INFORMATION Sleep Study Type: NPSG  Indication for sleep study: Fatigue, Snoring, Witnesses Apnea / Gasping During Sleep  Epworth Sleepiness Score: 5  SLEEP STUDY TECHNIQUE As per the AASM Manual for the Scoring of Sleep and Associated Events v2.3 (April 2016) with a hypopnea requiring 4% desaturations.  The channels recorded and monitored were frontal, central and occipital EEG, electrooculogram (EOG), submentalis EMG (chin), nasal and oral airflow, thoracic and abdominal wall motion, anterior tibialis EMG, snore microphone, electrocardiogram, and pulse oximetry.  MEDICATIONS Medications self-administered by patient taken the night of the study : N/A  SLEEP ARCHITECTURE The study was initiated at 10:10:34 PM and ended at 4:56:18 AM.  Sleep onset time was 11.7 minutes and the sleep efficiency was 86.3%. The total sleep time was 350 minutes.  Stage REM latency was 126.5 minutes.  The patient spent 8.4% of the night in stage N1 sleep, 63.1% in stage N2 sleep, 0.0% in stage N3 and 28.4% in REM.  Alpha intrusion was absent.  Supine sleep was 65.83%.  RESPIRATORY PARAMETERS The overall apnea/hypopnea index (AHI) was 18.9 per hour. There were 41 total apneas, including 11 obstructive, 11 central and 19 mixed apneas. There were 69 hypopneas and 12 RERAs.  The AHI during Stage REM sleep was 0.6 per hour.  AHI while supine was 23.7 per hour.  The mean oxygen saturation was 95.2%. The minimum SpO2 during sleep was 84.0%.  moderate snoring was noted during this study.  CARDIAC DATA The 2 lead EKG demonstrated sinus rhythm. The mean heart rate was 77.7 beats per minute. Other EKG  findings include: PVCs, PACs and nonsustained atrial tachycardia.  LEG MOVEMENT DATA The total PLMS were 0 with a resulting PLMS index of 0.0. Associated arousal with leg movement index was 0.0 .  IMPRESSIONS - Moderate obstructive sleep apnea occurred during this study (AHI = 18.9/h). - No significant central sleep apnea occurred during this study (CAI = 1.9/h). - Mild oxygen desaturation was noted during this study (Min O2 = 84.0%). - The patient snored with moderate snoring volume. - EKG findings include PVCs, PACs and nonsustained atrial tachycardia. - Clinically significant periodic limb movements did not occur during sleep. No significant associated arousals.  DIAGNOSIS - Obstructive Sleep Apnea (G47.33) - PVCs - Nonsustained Atrial Tachycardia  RECOMMENDATIONS - Therapeutic CPAP titration to determine optimal pressure required to alleviate sleep disordered breathing. - Positional therapy avoiding supine position during sleep. - Avoid alcohol, sedatives and other CNS depressants that may worsen sleep apnea and disrupt normal sleep architecture. - Sleep hygiene should be reviewed to assess factors that may improve sleep quality. - Weight management and regular exercise should be initiated or continued if appropriate.  [Electronically signed] 06/02/2020 08:58 AM  Armanda Magic MD, ABSM Diplomate, American Board of Sleep Medicine

## 2020-06-02 NOTE — Telephone Encounter (Signed)
Informed patient of sleep study results and patient understanding was verbalized. Patient understands his sleep study showed :they have sleep apnea and recommend CPAP titration. Please set up titration in the sleep lab.    Patient was grateful for the call and thanked me.  Titration to be scheduled

## 2020-06-02 NOTE — Telephone Encounter (Signed)
-----   Message from Quintella Reichert, MD sent at 06/02/2020  9:00 AM EDT ----- Please let patient know that they have sleep apnea and recommend CPAP titration. Please set up titration in the sleep lab.

## 2020-06-07 NOTE — Addendum Note (Signed)
Addended by: Reesa Chew on: 06/07/2020 02:16 PM   Modules accepted: Orders

## 2020-06-09 NOTE — Telephone Encounter (Signed)
IPatient is scheduled for CPAP Titration on 07/16/20. Patient understands his titration study will be done at [facility] sleep lab. Patient understands he will receive a letter in a week or so detailing appointment, date, time, and location. Patient understands to call if he does not receive the letter  in a timely manner.  Left detailed message on voicemail and informed patient to call.

## 2020-06-28 ENCOUNTER — Ambulatory Visit: Payer: Self-pay | Attending: Primary Care

## 2020-06-28 ENCOUNTER — Other Ambulatory Visit: Payer: Self-pay

## 2020-06-29 ENCOUNTER — Telehealth: Payer: Self-pay

## 2020-06-29 NOTE — Telephone Encounter (Signed)
**Note De-Identified  Obfuscation** A completed but old version of a [employer] Pt Asst application for Eliquis was left at the office without any required documents per [employer] for this pt.  I called him but got no answer so I left a message asking him to call Larita Fife at [phone] at [facility] at [address] office so we can discuss him calling [employer] to request that they mail him a current application.

## 2020-06-29 NOTE — Telephone Encounter (Signed)
**Note De-Identified  Obfuscation** The pt called me back and stated that [facility] normally fills his Eliquis RX at no charge to him so he wants to order a re-fill to see if they fill again at no charge to him and if so he does not want to apply with [employer] as he will not need assistance for his Eliquis cost.  He stated that he is ordering his Eliquis through [facility] now and that he will call me back within 24 hours to let me know if he was able or unable to get his Eliquis refilled as he only has enough Eliquis tablets to last until tomorrow evening.  He is aware that if they do not fill his Eliquis free of charge to call [employer] and I gave him their phone number to ask questions about their program/required documents and to request that they mail him a current application to his home. I advised him that once he receives the application to complete his part, obtain required documentation per [employer] and to bring all to the office drop off and that we will Dr Debby Bud part of the application and will fax all to [employer].  He is also aware that if his Eliquis is not filled that can give him some samples to pick up if we have them. He verbalized understanding to all of the above and thanked me for calling him to discuss.

## 2020-06-30 MED FILL — METOPROLOL SUCCINATE ER 50: 50 | 30 days supply | Qty: 30 | Fill #2

## 2020-06-30 MED FILL — FUROSEMIDE 40 MG TAB: 40 | 30 days supply | Qty: 60 | Fill #1

## 2020-06-30 MED FILL — !ELIQUIS 5MG TABLET: 5 | 30 days supply | Qty: 60 | Fill #1

## 2020-06-30 NOTE — Telephone Encounter (Signed)
Patient refilled his Eliquis again today.  Advised patient that this was the last fill that we are dispensing.  Patient was told that he would have to get this med through patient assistance due to cost.  Patient stated that MD was assisting and understood that his future fills would have to come from the [employer] patient assistance foundation.  Patient was advised to follow-up with his MD to complete enrollment.  This is the second month of advising patient to complete PAP enrollment and we even offered to help him with the process here and he declined.

## 2020-07-03 NOTE — Telephone Encounter (Signed)
Casey Bell is calling requesting to speak with Larita Fife in regards to his previous conversation with her about his Eliquis. Please advise.

## 2020-07-04 NOTE — Telephone Encounter (Signed)
**Note De-Identified  Obfuscation** The pt states that he has already called [employer] and requested that they mail him an application per our conversation on 11/18. He is aware that if approved for the remainder of this year that [employer] will ship him a 90 day supply of Eliquis free of charge and that he will need to re-apply in January 2022 to continue pt asst is approved.  He verbalized understanding to all info given and thanked me for calling him back.

## 2020-07-11 ENCOUNTER — Telehealth: Payer: Self-pay | Admitting: Primary Care

## 2020-07-11 NOTE — Telephone Encounter (Signed)
Pt was sent a letter from financial dept. Inform them, that the application they submitted was incomplete, since they were missing some documentation at the time of the appointment, Pt need to reschedule and resubmit all new papers and application for CAFA and OC, P.S. old documents has been sent back by mail to the Pt and Pt. need to make a new appt. 

## 2020-07-16 ENCOUNTER — Ambulatory Visit (HOSPITAL_BASED_OUTPATIENT_CLINIC_OR_DEPARTMENT_OTHER): Payer: Self-pay | Attending: Internal Medicine | Admitting: Cardiology

## 2020-07-16 ENCOUNTER — Other Ambulatory Visit: Payer: Self-pay

## 2020-07-16 DIAGNOSIS — Z7901 Long term (current) use of anticoagulants: Secondary | ICD-10-CM | POA: Insufficient documentation

## 2020-07-16 DIAGNOSIS — G4733 Obstructive sleep apnea (adult) (pediatric): Secondary | ICD-10-CM

## 2020-07-16 DIAGNOSIS — I4891 Unspecified atrial fibrillation: Secondary | ICD-10-CM | POA: Insufficient documentation

## 2020-07-18 NOTE — Procedures (Signed)
   Patient Name: Casey Bell, Casey Bell Date: 07/16/2020 Gender: Male D.O.B: [birth year] Age (years): 70 Referring Provider: Riley Lam Height (inches): 71 Interpreting Physician: Armanda Magic MD, ABSM Weight (lbs): 182 RPSGT: Armen Pickup BMI: 25 MRN: [id number] Neck Size: 17.00  CLINICAL INFORMATION The patient is referred for a CPAP titration to treat sleep apnea.  SLEEP STUDY TECHNIQUE As per the AASM Manual for the Scoring of Sleep and Associated Events v2.3 (April 2016) with a hypopnea requiring 4% desaturations.  The channels recorded and monitored were frontal, central and occipital EEG, electrooculogram (EOG), submentalis EMG (chin), nasal and oral airflow, thoracic and abdominal wall motion, anterior tibialis EMG, snore microphone, electrocardiogram, and pulse oximetry. Continuous positive airway pressure (CPAP) was initiated at the beginning of the study and titrated to treat sleep-disordered breathing.  MEDICATIONS Medications self-administered by patient taken the night of the study : ELIQUIS, FUROSEMIDE, METOPROLOL  TECHNICIAN COMMENTS Comments added by technician: PT WENT TO RESTROOM THREE TIME. Patient had difficulty initiating sleep. Patient was restless all through the night. Patient had more than two awakenings to use the bathroom Comments added by scorer: N/A  RESPIRATORY PARAMETERS Optimal PAP Pressure (cm): 12  AHI at Optimal Pressure (/hr):0 Overall Minimal O2 (%):94.0  Supine % at Optimal Pressure (%):N/A Minimal O2 at Optimal Pressure (%): 95.0   SLEEP ARCHITECTURE The study was initiated at 9:54:56PM and ended at 4:18:44AM.  Sleep onset time was 23.9 minutes and the sleep efficiency was 64.8%. The total sleep time was 255 minutes.  The patient spent 16.5% of the night in stage N1 sleep, 61.4% in stage N2 sleep, 0% in stage N3 and 22.2% in REM.Stage REM latency was 141 minutes  Wake after sleep onset was . Alpha intrusion was absent.  Supine sleep was 24.4%.  CARDIAC DATA The 2 lead EKG demonstrated sinus rhythm. The mean heart rate was 100.0 beats per minute. Other EKG findings include: PACs.  LEG MOVEMENT DATA The total Periodic Limb Movements of Sleep (PLMS) were 0. The PLMS index was 0. A PLMS index of <15 is considered normal in adults.  IMPRESSIONS - The optimal PAP pressure was 12 cm of water. - No significant Central Sleep Apnea was noted during this titration. - Significant oxygen desaturations were not observed during this titration (min O2 = 100.0%). - No snoring was audible during this study. - PACs were observed during this study. - No periodic limb movements were observed during this study. Arousals associated with PLMs were not significant.  DIAGNOSIS - Obstructive Sleep Apnea (G47.33)  RECOMMENDATIONS - Trial of CPAP therapy on 12 cm H2O with a Medium size Resmed Full Face Mask AirFit F20 mask and heated humidification. - Avoid alcohol, sedatives and other CNS depressants that may worsen sleep apnea and disrupt normal sleep architecture. - Sleep hygiene should be reviewed to assess factors that may improve sleep quality. - Weight management and regular exercise should be initiated or continued. - Return to Sleep Center for re-evaluation after 8  weeks of therapy  [Electronically signed] 07/18/2020 09:17 PM  Armanda Magic MD, ABSM Diplomate, American Board of Sleep Medicine

## 2020-07-20 ENCOUNTER — Telehealth: Payer: Self-pay | Admitting: *Deleted

## 2020-07-20 NOTE — Telephone Encounter (Signed)
-----   Message from Traci R Turner, MD sent at 07/18/2020  9:20 PM EST ----- °Please let patient know that they had a successful PAP titration and let DME know that orders are in EPIC.  Please set up 8 week OV with me.   °

## 2020-07-20 NOTE — Telephone Encounter (Signed)
Informed patient of sleep study results and patient understanding was verbalized. Patient understands his sleep study showed they had a successful PAP titration and let DME know that orders are in EPIC. Please set up 8 week OV with me.    Upon patient request DME selection is [facility]. Patient understands she/he will be contacted by [facility] to set up her/he cpap. Patient understands to call if [facility] does not contact her/he with new setup in a timely manner. Patient understands they will be called once confirmation has been received from [facility] that they have received their new machine to schedule 10 week follow up appointment.   [facility] notified of new cpap order  Please add to airview Patient was grateful for the call and thanked me.

## 2020-07-24 ENCOUNTER — Other Ambulatory Visit: Payer: Self-pay

## 2020-07-24 ENCOUNTER — Other Ambulatory Visit: Payer: Self-pay | Admitting: *Deleted

## 2020-07-24 DIAGNOSIS — R0609 Other forms of dyspnea: Secondary | ICD-10-CM

## 2020-07-24 DIAGNOSIS — I5042 Chronic combined systolic (congestive) and diastolic (congestive) heart failure: Secondary | ICD-10-CM

## 2020-07-24 DIAGNOSIS — I4891 Unspecified atrial fibrillation: Secondary | ICD-10-CM

## 2020-07-24 DIAGNOSIS — R600 Localized edema: Secondary | ICD-10-CM

## 2020-07-25 ENCOUNTER — Telehealth: Payer: Self-pay | Admitting: *Deleted

## 2020-07-25 LAB — BASIC METABOLIC PANEL
BUN/Creatinine Ratio: 13 (ref 10–24)
BUN: 13 mg/dL (ref 8–27)
CO2: 25 mmol/L (ref 20–29)
Calcium: 9.2 mg/dL (ref 8.6–10.2)
Chloride: 98 mmol/L (ref 96–106)
Creatinine, Ser: 0.99 mg/dL (ref 0.76–1.27)
GFR calc Af Amer: 94 mL/min/{1.73_m2} (ref >59)
GFR calc non Af Amer: 81 mL/min/{1.73_m2} (ref >59)
Glucose: 254 mg/dL — ABNORMAL HIGH (ref 65–99)
Potassium: 4.2 mmol/L (ref 3.5–5.2)
Sodium: 136 mmol/L (ref 134–144)

## 2020-07-25 LAB — PRO B NATRIURETIC PEPTIDE: NT-Pro BNP: 771 pg/mL — ABNORMAL HIGH (ref 0–210)

## 2020-07-25 NOTE — Addendum Note (Signed)
Addended by: Reesa Chew on: 07/25/2020 06:01 PM   Modules accepted: Level of Service, SmartSet

## 2020-07-25 NOTE — Telephone Encounter (Signed)
Informed patient of sleep study results and patient understanding was verbalized. Patient understands her sleep study showed they had a successful PAP titration and let DME know that orders are in EPIC. Please set up 8 week OV with me.  Pt is aware and agreeable to his results.   Upon patient request DME selection is [facility]. Patient understands she/he will be contacted by [facility] to set up her/he cpap. Patient understands to call if [facility] does not contact her/he with new setup in a timely manner. Patient understands they will be called once confirmation has been received from [facility] that they have received their new machine to schedule 10 week follow up appointment.   [facility] notified of new cpap order  Please add to airview Patient was grateful for the call and thanked me.

## 2020-07-25 NOTE — Telephone Encounter (Signed)
This encounter was created in error - please disregard.

## 2020-07-25 NOTE — Telephone Encounter (Signed)
-----   Message from Quintella Reichert, MD sent at 07/18/2020  9:20 PM EST ----- Please let patient know that they had a successful PAP titration and let DME know that orders are in EPIC.  Please set up 8 week OV with me.

## 2020-07-27 ENCOUNTER — Other Ambulatory Visit: Payer: Self-pay | Admitting: Internal Medicine

## 2020-07-27 ENCOUNTER — Ambulatory Visit (INDEPENDENT_AMBULATORY_CARE_PROVIDER_SITE_OTHER): Payer: Self-pay | Admitting: Internal Medicine

## 2020-07-27 ENCOUNTER — Encounter: Payer: Self-pay | Admitting: Internal Medicine

## 2020-07-27 ENCOUNTER — Other Ambulatory Visit: Payer: Self-pay

## 2020-07-27 VITALS — BP 104/70 | HR 77 | Ht 71.0 in | Wt 200.0 lb

## 2020-07-27 DIAGNOSIS — I5042 Chronic combined systolic (congestive) and diastolic (congestive) heart failure: Secondary | ICD-10-CM

## 2020-07-27 DIAGNOSIS — N529 Male erectile dysfunction, unspecified: Secondary | ICD-10-CM | POA: Insufficient documentation

## 2020-07-27 DIAGNOSIS — I483 Typical atrial flutter: Secondary | ICD-10-CM

## 2020-07-27 DIAGNOSIS — I5020 Unspecified systolic (congestive) heart failure: Secondary | ICD-10-CM | POA: Insufficient documentation

## 2020-07-27 MED ORDER — SPIRONOLACTONE 25 MG PO TABS: 25.0000 mg | ORAL_TABLET | Freq: Every day | ORAL | 3 refills | Status: DC

## 2020-07-27 MED ORDER — SILDENAFIL CITRATE 20 MG PO TABS: ORAL_TABLET | ORAL | 1 refills | Status: DC

## 2020-07-27 MED FILL — SILDENAFIL CITRATE 20 MG TA: 20 | 6 days supply | Qty: 30 | Fill #0

## 2020-07-27 MED FILL — SPIRONOLACTONE 25 MG TABLET: 25 | 30 days supply | Qty: 30 | Fill #0

## 2020-07-27 NOTE — Progress Notes (Signed)
Cardiology Office Note:    Date:  07/27/2020   ID:  Casey Bell, DOB [birth year], MRN [id number]  PCP:  [name], NP  [facility] Cardiologist:  Christell Constant, MD  [facility] Electrophysiologist:  None   Referring MD: [name], NP   CC: Follow up HF  History of Present Illness:    Casey Bell is a 64 y.o. male with a hx of AFl 2:1, CHADSVASC 2, Former Alcohol Use (down from a couple beers),OSA now no CPAP, DM with HTN, Psoriasis  who presented for evaluation of atrial flutter 04/27/20.Since starting on GDMT improvement BNP has improved.    Patient notes that he is doing well.  Since last visit notes new sleep study. There are no interval hospital/ED visit.    No chest pain or pressure .  No SOB/DOE and no PND/Orthopnea.  No weight gain or leg swelling.  No palpitations or syncope .  Notes erectile dysfunction with new medications; has urge and decreased firmness.  Occurs some of the time.  Ambulatory blood pressure 110-120/70-80.   Past Medical History:  Diagnosis Date  . Diabetes mellitus without complication (HCC)   . Psoriasis .  [name] Psoriasis     No past surgical history on file.  Current Medications: Current Meds  Medication Sig  . acetaminophen (TYLENOL) 500 MG tablet Take 500 mg by mouth every 6 (six) hours as needed for moderate pain.  [name] apixaban (ELIQUIS) 5 MG TABS tablet Take 1 tablet (5 mg total) by mouth 2 (two) times daily.  . Clobetasol Propionate 0.05 % lotion Apply 118 mLs topically 2 (two) times daily.  . Clobetasol Propionate 0.05 % shampoo Apply 0.5 mLs topically 2 (two) times daily.  . Emollient (LUBRIDERM) LOTN Apply 15 application topically in the morning and at bedtime.  . furosemide (LASIX) 40 MG tablet Take 1 tablet (40 mg total) by mouth 2 (two) times daily.  . metFORMIN (GLUCOPHAGE) 500 MG tablet Take 1 tablet (500 mg total) by mouth 2 (two) times daily with a meal.  . triamcinolone cream (KENALOG) 0.1 %  Apply 1 application topically 2 (two) times daily. Use 5 to 10 cc, with each application, from the neck to the toes.  Apply this with Lubriderm to extend the treatment to a bigger part of your body.  . [DISCONTINUED] potassium chloride (KLOR-CON 10) 10 MEQ tablet Take 1 tablet (10 mEq total) by mouth daily.     Allergies:   Patient has no known allergies.   Social History   Socioeconomic History  . Marital status: Divorced    Spouse name: Not on file  . Number of children: Not on file  . Years of education: Not on file  . Highest education level: Not on file  Occupational History  . Not on file  Tobacco Use  . Smoking status: Former Smoker    Quit date: 2010    Years since quitting: 11.9  . Smokeless tobacco: Never Used  Substance and Sexual Activity  . Alcohol use: Yes    Comment: few/week  . Drug use: Yes    Types: Marijuana  . Sexual activity: Not Currently  Other Topics Concern  . Not on file  Social History Narrative  . Not on file   Social Determinants of Health   Financial Resource Strain: Not on file  Food Insecurity: Not on file  Transportation Needs: Not on file  Physical Activity: Not on file  Stress: Not on file  Social  Connections: Not on file     Family History: The patient's family history includes Chronic Renal Failure in his mother; Heart failure in his mother. Sister CHF Mother CHF  ROS:   Please see the history of present illness.     All other systems reviewed and are negative.  EKGs/Labs/Other Studies Reviewed:    The following studies were reviewed today:  EKG:   07/27/20 SR rate 77 with Occasional PACs, with LAE 04/27/20: Atrial flutter with ventricular rate 69  03/27/20: Atrial flutter with 2:1 conduction rate 135  Transthoracic Echocardiogram: Date: 04/06/20 Results: 1. Left ventricular ejection fraction, by estimation, is 35 to 40%. The  left ventricle has moderately decreased function. The left ventricle  demonstrates regional  wall motion abnormalities (see scoring  diagram/findings for description). The left  ventricular internal cavity size was mildly dilated. Left ventricular  diastolic parameters are consistent with Grade III diastolic dysfunction  (restrictive). There is moderate akinesis of the left ventricular, entire  inferior wall.  2. Right ventricular systolic function is moderately reduced. The right  ventricular size is moderately enlarged. There is moderately elevated  pulmonary artery systolic pressure.  3. The mitral valve is normal in structure. Mild to moderate mitral valve  regurgitation. No evidence of mitral stenosis.  4. The aortic valve is normal in structure. Aortic valve regurgitation is  not visualized. No aortic stenosis is present.   Recent Labs: 03/28/2020: ALT 28; Hemoglobin 13.5; Magnesium 1.9; Platelets 189 04/04/2020: BNP 1,875.4 07/24/2020: BUN 13; Creatinine, Ser 0.99; NT-Pro BNP 771; Potassium 4.2; Sodium 136  Recent Lipid Panel    Component Value Date/Time   CHOL 205 (H) 04/02/2010 1949   TRIG 218 (H) 04/02/2010 1949   HDL 42 04/02/2010 1949   CHOLHDL 4.9 Ratio 04/02/2010 1949   VLDL 44 (H) 04/02/2010 1949   LDLCALC 119 (H) 04/02/2010 1949    Physical Exam:    VS:  BP 104/70   Pulse 77   Ht 5\' 11"  (1.803 m)   Wt 200 lb (90.7 kg)   SpO2 98%   BMI 27.89 kg/m     Wt Readings from Last 3 Encounters:  07/27/20 200 lb (90.7 kg)  06/01/20 182 lb (82.6 kg)  04/27/20 180 lb (81.6 kg)    GEN: Well nourished, well developed in no acute distress HEENT: Normal NECK: No JVD; No carotid bruits LYMPHATICS: No lymphadenopathy CARDIAC: RRR, no murmurs, rubs, gallops RESPIRATORY:  Clear to auscultation without rales, wheezing or rhonchi  ABDOMEN: Soft, non-tender, non-distended MUSCULOSKELETAL:  No edema; No deformity  SKIN: skin discoloration; skin is warm  NEUROLOGIC:  Alert and oriented x 3 PSYCHIATRIC:  Normal affect   ASSESSMENT:    1. Chronic combined  systolic and diastolic heart failure (HCC)   2. HFrEF (heart failure with reduced ejection fraction) (HCC)   3. Typical atrial flutter (HCC)   4. Erectile dysfunction, unspecified erectile dysfunction type    PLAN:    In order of problems listed above:  Heart Failure Reduced Ejection Fraction  BNP has improved on GDMT - NYHA class I, Stage B, euvolemic, etiology from AF; no ischemic eval prior - Diuretic regimen: Lasix 40 mg PO BID - Daily weights, and fluid restriction of < 2 L  - BMP and Mg in 7-10 days; holding potassium presently - Metoprolol succinate 50 mg PO Daily - on low dose lisinopril with dramatic improvement BP - aldactone 12.5 mg PO Daily start with labs in 7-10 days; will address Potassium then - patient  to continue to do ambulatory BP monitoring - SGLT2i would be reasonable if cost-effective - once has orange card or insurance will need repeat Echo and ischemic work up  Erectile Dysfunction - Sildenafil and metoprolol hold day of intimacy - discussed risk of hypotension and patient knows red flag warning signs; will continue AMB BP monitoring  Paroxysmal Atrial Flutter  - Risk factors include OSA and former alcohol use; discussed holiday heart syndrome - CHADSVASC= 2 . - TSH WNL, imaging notable for no left atrial dilation - discussed alcohol and exercise as preventive factors - Continue anticoagulation with Eliquis.  - Continue rate control with metoprolol succinate - Consideration for ablation if recurrent  Will Reach out to Social Work about [employer] issues  3 monnth follow up unless new symptoms or abnormal test results warranting change in plan  Would be reasonable for  APP Follow up    Medication Adjustments/Labs and Tests Ordered: Current medicines are reviewed at length with the patient today.  Concerns regarding medicines are outlined above.  Orders Placed This Encounter  Procedures  . Basic metabolic panel  . Magnesium  . EKG 12-Lead    Meds ordered this encounter  Medications  . sildenafil (REVATIO) 20 MG tablet    Sig: Take 2-5 tablets once daily as needed prior to sexual activity.    Dispense:  30 tablet    Refill:  1  . spironolactone (ALDACTONE) 25 MG tablet    Sig: Take 1 tablet (25 mg total) by mouth daily.    Dispense:  90 tablet    Refill:  3    Patient Instructions  Medication Instructions:  1) STOP POTASSIUM 2) START SPIRONOLACTONE 25 mg daily 3) you have been given a prescription for SILDENAFIL 20 mg tablets. You may take 2-5 tablets ONCE DAILY AS NEEDED prior to sexual activity. HOLD YOUR METOPROLOL if you know you will be taking Sildenafil. Please continue to check your blood pressure and let us know if it is too low. *If you need a refill on your cardiac medications before your next appointment, please call your pharmacy*  Lab Work: Your provider recommends that you return for lab work in 7-10 days. If you have labs (blood work) drawn today and your tests are completely normal, you will receive your results only by: [name] MyChart Message (if you have MyChart) OR . A paper copy in the mail If you have any lab test that is abnormal or we need to change your treatment, we will call you to review the results.  Follow-Up: At [facility], you and your health needs are our priority.  As part of our continuing mission to provide you with exceptional heart care, we have created designated Provider Care Teams.  These Care Teams include your primary Cardiologist (physician) and Advanced Practice Providers (APPs -  Physician Assistants and Nurse Practitioners) who all work together to provide you with the care you need, when you need it. Your next appointment:   3 month(s) The format for your next appointment:   In Person Provider:   Riley Lam, MD     Signed, Christell Constant, MD  07/27/2020 11:06 AM    [facility]

## 2020-07-27 NOTE — Patient Instructions (Signed)
Medication Instructions:  1) STOP POTASSIUM 2) START SPIRONOLACTONE 25 mg daily 3) you have been given a prescription for SILDENAFIL 20 mg tablets. You may take 2-5 tablets ONCE DAILY AS NEEDED prior to sexual activity. HOLD YOUR METOPROLOL if you know you will be taking Sildenafil. Please continue to check your blood pressure and let us know if it is too low. *If you need a refill on your cardiac medications before your next appointment, please call your pharmacy*  Lab Work: Your provider recommends that you return for lab work in 7-10 days. If you have labs (blood work) drawn today and your tests are completely normal, you will receive your results only by:  MyChart Message (if you have MyChart) OR  A paper copy in the mail If you have any lab test that is abnormal or we need to change your treatment, we will call you to review the results.  Follow-Up: At [facility], you and your health needs are our priority.  As part of our continuing mission to provide you with exceptional heart care, we have created designated Provider Care Teams.  These Care Teams include your primary Cardiologist (physician) and Advanced Practice Providers (APPs -  Physician Assistants and Nurse Practitioners) who all work together to provide you with the care you need, when you need it. Your next appointment:   3 month(s) The format for your next appointment:   In Person Provider:   Riley Lam, MD

## 2020-08-07 ENCOUNTER — Other Ambulatory Visit: Payer: Self-pay | Admitting: *Deleted

## 2020-08-07 ENCOUNTER — Other Ambulatory Visit: Payer: Self-pay

## 2020-08-07 DIAGNOSIS — I5042 Chronic combined systolic (congestive) and diastolic (congestive) heart failure: Secondary | ICD-10-CM

## 2020-08-08 ENCOUNTER — Telehealth: Payer: Self-pay

## 2020-08-08 LAB — BASIC METABOLIC PANEL
BUN/Creatinine Ratio: 11 (ref 10–24)
BUN: 12 mg/dL (ref 8–27)
CO2: 23 mmol/L (ref 20–29)
Calcium: 8.9 mg/dL (ref 8.6–10.2)
Chloride: 99 mmol/L (ref 96–106)
Creatinine, Ser: 1.06 mg/dL (ref 0.76–1.27)
GFR calc Af Amer: 87 mL/min/{1.73_m2} (ref >59)
GFR calc non Af Amer: 75 mL/min/{1.73_m2} (ref >59)
Glucose: 213 mg/dL — ABNORMAL HIGH (ref 65–99)
Potassium: 4.5 mmol/L (ref 3.5–5.2)
Sodium: 141 mmol/L (ref 134–144)

## 2020-08-08 LAB — MAGNESIUM: Magnesium: 1.5 mg/dL — ABNORMAL LOW (ref 1.6–2.3)

## 2020-08-08 NOTE — Telephone Encounter (Signed)
**Note De-identified  Obfuscation** -----  **Note De-Identified  Obfuscation** Message from Cleon Gustin sent at 08/07/2020  3:11 PM EST ----- Mariana Arn,    Mr Pracht came in for his lab and stated he has not heard from [employer] in regards to his Eliquis. What is his next step? Please call.  Thank you,  Lovina Reach [phone]

## 2020-08-08 NOTE — Telephone Encounter (Signed)
**Note De-Identified  Obfuscation** The pt states that [employer] never mailed him an application and that he is down to his last Eliquis tablet. I have advised him to contact [employer] again to request that they mail him an application to his home and he is aware that once he receives the application to complete his part, obtain required documents per [employer] and to bring all to Dr Debby Bud office to drop off and that we will take care of the provider page of the application and will fax all to [employer].  He is also aware that we are leaving him 2 sample boxes of Eliquis 5 mg in the front office for him to pick up.  He verbalized understanding and thanked me for calling him back to discuss and for our help.

## 2020-08-09 ENCOUNTER — Other Ambulatory Visit: Payer: Self-pay | Admitting: Internal Medicine

## 2020-08-09 ENCOUNTER — Telehealth: Payer: Self-pay | Admitting: *Deleted

## 2020-08-09 MED ORDER — MAGNESIUM OXIDE -MG SUPPLEMENT 400 (240 MG) MG PO TABS: 400.0000 mg | ORAL_TABLET | Freq: Every day | ORAL | 11 refills | Status: DC

## 2020-08-09 MED FILL — MAGNESIUM OXIDE 400 MG TAB: 400 | 30 days supply | Qty: 30 | Fill #0

## 2020-08-09 NOTE — Telephone Encounter (Signed)
-----   Message from Christell Constant, MD sent at 08/08/2020  7:59 PM EST ----- Mag Oxide 400 mg PO Daily

## 2020-08-28 ENCOUNTER — Ambulatory Visit: Payer: Self-pay

## 2020-08-30 NOTE — Telephone Encounter (Signed)
Leaving pt an [employer] pt assistance application and 2 boxes of Eliquis 5 mg tablet at the front desk for pt to pick up. Lot: [id number]  Exp: 11/2021

## 2020-08-30 NOTE — Telephone Encounter (Signed)
**Note De-Identified  Obfuscation** The pt insists that he has called [employer] numerous times requesting that they mail him an application since we last spoke on 12/28 and that he has not received one yet. He states that he called [employer] before calling me this morning and was advised that they faxed an application to our office for him. He reports that he has 1 Eliquis tablet left.  I have advised him that we will leave him 2 sample boxes of Eliquis 5 mg and a [employer] application in the front office at Dr Debby Bud office on [facility] for him to pick up. He is aware to complete his part of the application, obtain required documents per [employer], to bring all to the office to drop off ASAP and that we will handle the provider page and will fax all to [employer].  He verbalized understanding and thanked me for our assistance.

## 2020-09-01 MED FILL — FUROSEMIDE 40 MG TAB: 40 | 30 days supply | Qty: 60 | Fill #2

## 2020-09-04 ENCOUNTER — Ambulatory Visit: Payer: Self-pay

## 2020-09-12 ENCOUNTER — Ambulatory Visit: Payer: Self-pay

## 2020-09-18 ENCOUNTER — Telehealth: Payer: Self-pay

## 2020-09-18 ENCOUNTER — Ambulatory Visit: Payer: Self-pay | Attending: Family Medicine

## 2020-09-18 ENCOUNTER — Other Ambulatory Visit: Payer: Self-pay

## 2020-09-18 NOTE — Telephone Encounter (Signed)
**Note De-Identified  Obfuscation** The pt left his [employer] application at the office with no documents included. He did not write in how many people live in his household and [employer] requires this information. I called the pt but got no answer so I left a message on his VM asking him to call Larita Fife back at [phone] at Dr Debby Bud office.

## 2020-09-19 ENCOUNTER — Telehealth: Payer: Self-pay

## 2020-09-19 ENCOUNTER — Telehealth: Payer: Self-pay | Admitting: Internal Medicine

## 2020-09-19 NOTE — Telephone Encounter (Signed)
Paperwork signed by Dr. Izora Ribas and faxed.

## 2020-09-19 NOTE — Telephone Encounter (Signed)
**Note De-Identified  Obfuscation** The pt left his [employer] application at the office without any documents. I have completed the provider page if the application and emailed it to our triage team lead so she can obtain Dr Trecia Rogers signature, date it, and to fax to fax number written on cover letter.

## 2020-09-19 NOTE — Telephone Encounter (Signed)
**Note De-Identified  Obfuscation** See phone note from yesterday (09/18/20).

## 2020-09-19 NOTE — Telephone Encounter (Signed)
Patient is returning a call for [employer]. Please advise.

## 2020-09-19 NOTE — Telephone Encounter (Signed)
**Note De-Identified  Obfuscation** Per the pt only he and his brother live in his house and he gave me verbal permission to write 2 on his application for the question "how many people live in your household?".  He states that he is almost out of Eliquis and is requesting Samples.  I will ask Tresa Endo and/or [place] to leave him 2 boxes of Eliquis 5mg  samples for him to pick up in the front office.

## 2020-10-04 ENCOUNTER — Telehealth (INDEPENDENT_AMBULATORY_CARE_PROVIDER_SITE_OTHER): Payer: Self-pay | Admitting: Primary Care

## 2020-10-04 ENCOUNTER — Encounter (INDEPENDENT_AMBULATORY_CARE_PROVIDER_SITE_OTHER): Payer: Self-pay | Admitting: Primary Care

## 2020-10-04 ENCOUNTER — Other Ambulatory Visit: Payer: Self-pay

## 2020-10-04 DIAGNOSIS — L409 Psoriasis, unspecified: Secondary | ICD-10-CM

## 2020-10-04 NOTE — Addendum Note (Signed)
Addended by: [name] on: 10/04/2020 04:58 PM   Modules accepted: Orders

## 2020-10-04 NOTE — Progress Notes (Signed)
Pt has orange card

## 2020-10-04 NOTE — Progress Notes (Signed)
Virtual Visit via Telephone Note  I connected with Casey Bell on 10/04/20 at  3:50 PM EST by telephone and verified that I am speaking with the correct person using two identifiers.  Location: Patient: home Provider: home    I discussed the limitations, risks, security and privacy concerns of performing an evaluation and management service by telephone and the availability of in person appointments. I also discussed with the patient that there may be a patient responsible charge related to this service. The patient expressed understanding and agreed to proceed.   History of Present Illness: [name]. Milich is a 65 year old male requesting referral to dermatology for his psoriasis- arms and legs.  Pruritus all over with plaque on hands , elbows.      Past Medical History:  Diagnosis Date  . Diabetes mellitus without complication (HCC)   . Psoriasis .  [name] Psoriasis    Current Outpatient Medications on File Prior to Visit  Medication Sig Dispense Refill  . acetaminophen (TYLENOL) 500 MG tablet Take 500 mg by mouth every 6 (six) hours as needed for moderate pain.    [name] apixaban (ELIQUIS) 5 MG TABS tablet Take 1 tablet (5 mg total) by mouth 2 (two) times daily. 60 tablet 5  . Clobetasol Propionate 0.05 % lotion Apply 118 mLs topically 2 (two) times daily. 59 mL 1  . Clobetasol Propionate 0.05 % shampoo Apply 0.5 mLs topically 2 (two) times daily. 118 mL 2  . Emollient (LUBRIDERM) LOTN Apply 15 application topically in the morning and at bedtime. 240 mL 0  . furosemide (LASIX) 40 MG tablet Take 1 tablet (40 mg total) by mouth 2 (two) times daily. 180 tablet 2  . Magnesium Oxide 400 (240 Mg) MG TABS Take 1 tablet (400 mg total) by mouth daily. 30 tablet 11  . metFORMIN (GLUCOPHAGE) 500 MG tablet Take 1 tablet (500 mg total) by mouth 2 (two) times daily with a meal.    . sildenafil (REVATIO) 20 MG tablet Take 2-5 tablets once daily as needed prior to sexual activity. 30 tablet 1  .  spironolactone (ALDACTONE) 25 MG tablet Take 1 tablet (25 mg total) by mouth daily. 90 tablet 3  . triamcinolone cream (KENALOG) 0.1 % Apply 1 application topically 2 (two) times daily. Use 5 to 10 cc, with each application, from the neck to the toes.  Apply this with Lubriderm to extend the treatment to a bigger part of your body. 120 g 0  . lisinopril (ZESTRIL) 2.5 MG tablet Take 1 tablet (2.5 mg total) by mouth daily. 90 tablet 3  . metoprolol succinate (TOPROL-XL) 50 MG 24 hr tablet Take 1 tablet (50 mg total) by mouth daily. Take with or immediately following a meal. 90 tablet 3   No current facility-administered medications on file prior to visit.   Observations Dione Housekeeper: There were no vitals taken for this visit.  Pertinent positive and negative noted in HPI   Assessment and Plan: [name] was seen today for referral.  Diagnoses and all orders for this visit:  Psoriasis -     Ambulatory referral to Dermatology    Follow Up Instructions:    I discussed the assessment and treatment plan with the patient. The patient was provided an opportunity to ask questions and all were answered. The patient agreed with the plan and demonstrated an understanding of the instructions.   The patient was advised to call back or seek an in-person evaluation if the symptoms worsen  or if the condition fails to improve as anticipated.  I provided 10 minutes of non-face-to-face time during this encounter.   [name], NP

## 2020-10-08 NOTE — Addendum Note (Signed)
Addended by: [name] on: 10/08/2020 06:05 PM   Modules accepted: Level of Service

## 2020-10-11 MED FILL — LISINOPRIL 2.5 MG TABLET: 2.5 | 30 days supply | Qty: 30 | Fill #2

## 2020-10-11 MED FILL — MAGNESIUM OXIDE 400 MG TAB: 400 | 30 days supply | Qty: 30 | Fill #1

## 2020-10-11 MED FILL — FUROSEMIDE 40 MG TAB: 40 | 30 days supply | Qty: 60 | Fill #3

## 2020-10-11 MED FILL — SPIRONOLACTONE 25 MG TABLET: 25 | 30 days supply | Qty: 30 | Fill #1

## 2020-10-11 MED FILL — METOPROLOL SUCCINATE ER 50: 50 | 30 days supply | Qty: 30 | Fill #3

## 2020-10-13 ENCOUNTER — Telehealth: Payer: Self-pay | Admitting: Internal Medicine

## 2020-10-13 NOTE — Telephone Encounter (Signed)
Patient calling the office for samples of medication:   1.  What medication and dosage are you requesting samples for? apixaban (ELIQUIS) 5 MG TABS tablet  2.  Are you currently out of this medication?  Patient states he has a 1 day supply of medication remaining.

## 2020-10-13 NOTE — Telephone Encounter (Signed)
Hey Lynn, LPN, can you please advise on this matter. Thank you  °

## 2020-10-13 NOTE — Telephone Encounter (Signed)
I called [employer] to check on the pts application and was advised by Victorino Dike that they have not received an application for the pts Eliquis this year.  I have emailed the pts application to [place], Materials engineer with request that she have Dr Izora Ribas sign and date it and to fax to [facility].  No answer so I left a message on the pts VM asking him to call Larita Fife at [phone] at Dr Trecia Rogers office.

## 2020-10-13 NOTE — Telephone Encounter (Signed)
Thanks Larita Fife, LPN, I will leave 2 boxes of Eliquis 5 mg tablets at the front desk for pt to pick up.

## 2020-10-13 NOTE — Telephone Encounter (Signed)
The pt is aware that we are re-faxing his [employer] application again today and that we are leaving him 2 sample boxes or Eliquis 5 mg in the front office at Dr Debby Bud office for him to pick up.  He thanked me for our help.

## 2020-10-13 NOTE — Telephone Encounter (Signed)
Pt is returning call.  

## 2020-10-13 NOTE — Telephone Encounter (Signed)
Dr. Izora Ribas has signed and dated the [facility] application. It has been faxed to [phone]. Confirmation received that all 5 pages were faxed successfully.

## 2020-10-26 NOTE — Telephone Encounter (Signed)
Received document and faxed to number provided.

## 2020-10-26 NOTE — Telephone Encounter (Signed)
Received fax from [employer] that pt signed the form but did not date it. This has been dated and faxed back to [employer] for review.

## 2020-10-26 NOTE — Telephone Encounter (Signed)
Letter from [employer] sating that the pt did not write date beside his signature on his application. They did send the pts signature pg back with the fax.  I have written in date: 09/18/2020 beside the pts signature and emailed our [employer]. TTL for today with request that she fax to [facility] at fax number written on cover letter or to place in nurses box in Medical Records to be faxed.

## 2020-10-30 ENCOUNTER — Other Ambulatory Visit: Payer: Self-pay

## 2020-10-30 ENCOUNTER — Ambulatory Visit (INDEPENDENT_AMBULATORY_CARE_PROVIDER_SITE_OTHER): Payer: Self-pay | Admitting: Internal Medicine

## 2020-10-30 ENCOUNTER — Other Ambulatory Visit: Payer: Self-pay | Admitting: Internal Medicine

## 2020-10-30 ENCOUNTER — Encounter: Payer: Self-pay | Admitting: Internal Medicine

## 2020-10-30 VITALS — BP 90/40 | HR 76 | Ht 71.0 in | Wt 198.0 lb

## 2020-10-30 DIAGNOSIS — E119 Type 2 diabetes mellitus without complications: Secondary | ICD-10-CM

## 2020-10-30 DIAGNOSIS — I5020 Unspecified systolic (congestive) heart failure: Secondary | ICD-10-CM

## 2020-10-30 DIAGNOSIS — I4891 Unspecified atrial fibrillation: Secondary | ICD-10-CM

## 2020-10-30 DIAGNOSIS — L409 Psoriasis, unspecified: Secondary | ICD-10-CM

## 2020-10-30 DIAGNOSIS — I5042 Chronic combined systolic (congestive) and diastolic (congestive) heart failure: Secondary | ICD-10-CM

## 2020-10-30 MED ORDER — DAPAGLIFLOZIN PROPANEDIOL 10 MG PO TABS: 10.0000 mg | ORAL_TABLET | Freq: Every day | ORAL | 3 refills | Status: DC

## 2020-10-30 NOTE — Patient Instructions (Signed)
Medication Instructions:  Your physician has recommended you make the following change in your medication:  START: Farxiga 10 mg by mouth daily (please call the office if you have trouble paying for this medication)  *If you need a refill on your cardiac medications before your next appointment, please call your pharmacy*   Lab Work: TODAY: BMP, BNP, and Mg If you have labs (blood work) drawn today and your tests are completely normal, you will receive your results only by: [name] MyChart Message (if you have MyChart) OR . A paper copy in the mail If you have any lab test that is abnormal or we need to change your treatment, we will call you to review the results.   Testing/Procedures: Your physician has requested that you have an echocardiogram. Echocardiography is a painless test that uses sound waves to create images of your heart. It provides your doctor with information about the size and shape of your heart and how well your heart's chambers and valves are working. This procedure takes approximately one hour. There are no restrictions for this procedure.     Follow-Up: At [facility], you and your health needs are our priority.  As part of our continuing mission to provide you with exceptional heart care, we have created designated Provider Care Teams.  These Care Teams include your primary Cardiologist (physician) and Advanced Practice Providers (APPs -  Physician Assistants and Nurse Practitioners) who all work together to provide you with the care you need, when you need it.   Your next appointment:   5-6 month(s)  The format for your next appointment:   In Person  Provider:   You may see Christell Constant, MD or one of the following Advanced Practice Providers on your designated Care Team:    Ronie Spies, PA-C  Jacolyn Reedy, PA-C

## 2020-10-30 NOTE — Telephone Encounter (Signed)
Letter received from [employer] stating that they have approved the pt for asst with his Eliquis until 10/25/2021.  The letter states that they have also notified the pt of this approval as well. 

## 2020-10-30 NOTE — Progress Notes (Signed)
Cardiology Office Note:    Date:  10/30/2020   ID:  Casey Bell, DOB 11-19-1955, MRN [id number]  PCP:  [name], NP  [facility] Cardiologist:  Christell Constant, MD  [facility] Electrophysiologist:  None   Referring MD: [name], NP   CC: Follow up HF  History of Present Illness:    Casey Bell is a 65 y.o. male with a hx of AFl 2:1, CHADSVASC 2, Former Alcohol Use (down from a couple beers),OSA now no CPAP, DM with HTN, Psoriasis  who presented for evaluation of atrial flutter 04/27/20.Since starting on GDMT improvement BNP has improved.  Seen In interim of this visit, patient stopped taking potassium; but has the orange card.  Notes that his psoriasis has been acting up.  Patient notes that he is doing well.  Since last visit notes no shortness of breath .  There are no interval hospital/ED visit.    No chest pain or pressure .  No SOB/DOE and no PND/Orthopnea.  No weight gain or leg swelling.  No palpitations or syncope .  No erectile dysfunction.   Past Medical History:  Diagnosis Date  . Diabetes mellitus without complication (HCC)   . Psoriasis .  [name] Psoriasis     No past surgical history on file.  Current Medications: Current Meds  Medication Sig  . acetaminophen (TYLENOL) 500 MG tablet Take 500 mg by mouth every 6 (six) hours as needed for moderate pain.  [name] apixaban (ELIQUIS) 5 MG TABS tablet Take 1 tablet (5 mg total) by mouth 2 (two) times daily.  . Clobetasol Propionate 0.05 % lotion Apply 118 mLs topically 2 (two) times daily.  . Clobetasol Propionate 0.05 % shampoo Apply 0.5 mLs topically 2 (two) times daily.  . dapagliflozin propanediol (FARXIGA) 10 MG TABS tablet Take 1 tablet (10 mg total) by mouth daily before breakfast.  . Emollient (LUBRIDERM) LOTN Apply 15 application topically in the morning and at bedtime.  . furosemide (LASIX) 40 MG tablet Take 1 tablet (40 mg total) by mouth 2 (two) times daily.  [name] lisinopril  (ZESTRIL) 2.5 MG tablet Take 2.5 mg by mouth daily.  . Magnesium Oxide 400 (240 Mg) MG TABS Take 1 tablet (400 mg total) by mouth daily.  . metFORMIN (GLUCOPHAGE) 500 MG tablet Take 1 tablet (500 mg total) by mouth 2 (two) times daily with a meal.  . metoprolol succinate (TOPROL-XL) 50 MG 24 hr tablet Take 50 mg by mouth daily. Take with or immediately following a meal.  . sildenafil (REVATIO) 20 MG tablet Take 2-5 tablets once daily as needed prior to sexual activity.  [name] spironolactone (ALDACTONE) 25 MG tablet Take 1 tablet (25 mg total) by mouth daily.  [name] triamcinolone cream (KENALOG) 0.1 % Apply 1 application topically 2 (two) times daily. Use 5 to 10 cc, with each application, from the neck to the toes.  Apply this with Lubriderm to extend the treatment to a bigger part of your body.     Allergies:   Patient has no allergy information on record.   Social History   Socioeconomic History  . Marital status: Divorced    Spouse name: Not on file  . Number of children: Not on file  . Years of education: Not on file  . Highest education level: Not on file  Occupational History  . Not on file  Tobacco Use  . Smoking status: Former Smoker    Quit date: 2010  Years since quitting: 12.2  . Smokeless tobacco: Never Used  Substance and Sexual Activity  . Alcohol use: Yes    Comment: few/week  . Drug use: Yes    Types: Marijuana  . Sexual activity: Not Currently  Other Topics Concern  . Not on file  Social History Narrative  . Not on file   Social Determinants of Health   Financial Resource Strain: Not on file  Food Insecurity: Not on file  Transportation Needs: Not on file  Physical Activity: Not on file  Stress: Not on file  Social Connections: Not on file     Family History: The patient's family history includes Chronic Renal Failure in his mother; Heart failure in his mother. Sister CHF Mother CHF  ROS:   Please see the history of present illness.     All other  systems reviewed and are negative.  EKGs/Labs/Other Studies Reviewed:    The following studies were reviewed today:  EKG:   07/27/20 SR rate 77 with Occasional PACs, with LAE 04/27/20: Atrial flutter with ventricular rate 69  03/27/20: Atrial flutter with 2:1 conduction rate 135  Transthoracic Echocardiogram: Date: 04/06/20 Results: 1. Left ventricular ejection fraction, by estimation, is 35 to 40%. The  left ventricle has moderately decreased function. The left ventricle  demonstrates regional wall motion abnormalities (see scoring  diagram/findings for description). The left  ventricular internal cavity size was mildly dilated. Left ventricular  diastolic parameters are consistent with Grade III diastolic dysfunction  (restrictive). There is moderate akinesis of the left ventricular, entire  inferior wall.  2. Right ventricular systolic function is moderately reduced. The right  ventricular size is moderately enlarged. There is moderately elevated  pulmonary artery systolic pressure.  3. The mitral valve is normal in structure. Mild to moderate mitral valve  regurgitation. No evidence of mitral stenosis.  4. The aortic valve is normal in structure. Aortic valve regurgitation is  not visualized. No aortic stenosis is present.   Recent Labs: 03/28/2020: ALT 28; Hemoglobin 13.5; Platelets 189 04/04/2020: BNP 1,875.4 07/24/2020: NT-Pro BNP 771 08/07/2020: BUN 12; Creatinine, Ser 1.06; Magnesium 1.5; Potassium 4.5; Sodium 141  Recent Lipid Panel    Component Value Date/Time   CHOL 205 (H) 04/02/2010 1949   TRIG 218 (H) 04/02/2010 1949   HDL 42 04/02/2010 1949   CHOLHDL 4.9 Ratio 04/02/2010 1949   VLDL 44 (H) 04/02/2010 1949   LDLCALC 119 (H) 04/02/2010 1949    Physical Exam:    VS:  BP (!) 90/40   Pulse 76   Ht 5\' 11"  (1.803 m)   Wt 198 lb (89.8 kg)   SpO2 97%   BMI 27.62 kg/m     Wt Readings from Last 3 Encounters:  10/30/20 198 lb (89.8 kg)  07/27/20 200 lb  (90.7 kg)  06/01/20 182 lb (82.6 kg)    GEN: Well nourished, well developed in no acute distress HEENT: Normal NECK: No JVD; No carotid bruits LYMPHATICS: No lymphadenopathy CARDIAC: RRR, no murmurs, rubs, gallops RESPIRATORY:  Clear to auscultation without rales, wheezing or rhonchi  ABDOMEN: Soft, non-tender, non-distended MUSCULOSKELETAL:  No edema; No deformity  SKIN: skin discoloration; bilateral arm lesions noted NEUROLOGIC:  Alert and oriented x 3 PSYCHIATRIC:  Normal affect   ASSESSMENT:    1. HFrEF (heart failure with reduced ejection fraction) (HCC)   2. Chronic combined systolic and diastolic heart failure (HCC)   3. Atrial fibrillation with RVR (HCC)   4. Diabetes mellitus without complication (HCC)  5. Psoriasis    PLAN:    In order of problems listed above:  Heart Failure Reduced Ejection Fraction  Diabetes Mellitus with Psoriasis (coronary risk factors) - NYHA class I, Stage B, euvolemic, etiology likely AF or alcohol; no ischemic eval prior - Diuretic regimen: Lasix 40 mg PO BID - Daily weights, and fluid restriction of < 2 L  - BMP and BNP, Magneisum level - Metoprolol succinate 50 mg PO Daily - lisinopril 2.5 (little BP room) - Aldactone 25 mg- patient is off of potassium - patient to continue to do ambulatory BP monitoring - will attempt Farixga 10 mg PO daily - will repeat Echo and ischemic work up (possible CCTA depending on recover) based on results  Erectile Dysfunction stable on below regimen - Sildenafil and metoprolol hold day of intimacy - discussed risk of hypotension and patient knows red flag warning signs; will continue AMB BP monitoring  Paroxysmal Atrial Flutter  - Risk factors include OSA and former alcohol use - CHADSVASC= 2 . - TSH WNL, imaging notable for no left atrial dilation - discussed alcohol and exercise as preventive factors - Continue anticoagulation with Eliquis.  - Continue rate control with metoprolol  succinate  Time Spent Directly with Patient:   I have spent a total of 40  with the patient reviewing hospital notes, telemetry, EKGs, labs and examining the patient as well as establishing an assessment and plan that was discussed personally with the patient.  > 50% of time was spent in direct patient care; including assisting patient getting dermatology care (hopefully).  Fall follow up unless new symptoms or abnormal test results warranting change in plan  Would be reasonable for  Video Visit Follow up Would be reasonable for  APP Follow up     Medication Adjustments/Labs and Tests Ordered: Current medicines are reviewed at length with the patient today.  Concerns regarding medicines are outlined above.  Orders Placed This Encounter  Procedures  . Basic metabolic panel  . Pro b natriuretic peptide (BNP)  . Magnesium  . ECHOCARDIOGRAM COMPLETE   Meds ordered this encounter  Medications  . dapagliflozin propanediol (FARXIGA) 10 MG TABS tablet    Sig: Take 1 tablet (10 mg total) by mouth daily before breakfast.    Dispense:  90 tablet    Refill:  3    Patient Instructions  Medication Instructions:  Your physician has recommended you make the following change in your medication:  START: Farxiga 10 mg by mouth daily (please call the office if you have trouble paying for this medication)  *If you need a refill on your cardiac medications before your next appointment, please call your pharmacy*   Lab Work: TODAY: BMP, BNP, and Mg If you have labs (blood work) drawn today and your tests are completely normal, you will receive your results only by: [name] MyChart Message (if you have MyChart) OR . A paper copy in the mail If you have any lab test that is abnormal or we need to change your treatment, we will call you to review the results.   Testing/Procedures: Your physician has requested that you have an echocardiogram. Echocardiography is a painless test that uses sound waves  to create images of your heart. It provides your doctor with information about the size and shape of your heart and how well your heart's chambers and valves are working. This procedure takes approximately one hour. There are no restrictions for this procedure.     Follow-Up: At [facility],  you and your health needs are our priority.  As part of our continuing mission to provide you with exceptional heart care, we have created designated Provider Care Teams.  These Care Teams include your primary Cardiologist (physician) and Advanced Practice Providers (APPs -  Physician Assistants and Nurse Practitioners) who all work together to provide you with the care you need, when you need it.   Your next appointment:   5-6 month(s)  The format for your next appointment:   In Person  Provider:   You may see Christell Constant, MD or one of the following Advanced Practice Providers on your designated Care Team:    Ronie Spies, PA-C  Jacolyn Reedy, PA-C         Signed, Christell Constant, MD  10/30/2020 4:20 PM    [facility]

## 2020-10-30 NOTE — Telephone Encounter (Signed)
Letter received from [facility] stating that they have approved the pt for asst with his Eliquis until 10/25/2021.  The letter states that they have also notified the pt of this approval as well.

## 2020-10-31 LAB — BASIC METABOLIC PANEL
BUN/Creatinine Ratio: 16 (ref 10–24)
BUN: 19 mg/dL (ref 8–27)
CO2: 19 mmol/L — ABNORMAL LOW (ref 20–29)
Calcium: 9.4 mg/dL (ref 8.6–10.2)
Chloride: 93 mmol/L — ABNORMAL LOW (ref 96–106)
Creatinine, Ser: 1.19 mg/dL (ref 0.76–1.27)
Glucose: 143 mg/dL — ABNORMAL HIGH (ref 65–99)
Potassium: 4.2 mmol/L (ref 3.5–5.2)
Sodium: 131 mmol/L — ABNORMAL LOW (ref 134–144)
eGFR: 69 mL/min/{1.73_m2} (ref >59)

## 2020-10-31 LAB — PRO B NATRIURETIC PEPTIDE: NT-Pro BNP: 691 pg/mL — ABNORMAL HIGH (ref 0–210)

## 2020-10-31 LAB — MAGNESIUM: Magnesium: 1.8 mg/dL (ref 1.6–2.3)

## 2020-10-31 MED FILL — FARXIGA 10 MG TABLET: 10 | 30 days supply | Qty: 30 | Fill #0

## 2020-11-22 ENCOUNTER — Other Ambulatory Visit: Payer: Self-pay

## 2020-11-22 MED ORDER — LISINOPRIL 2.5 MG PO TABS
2.5000 mg | ORAL_TABLET | Freq: Every day | ORAL | 0 refills | Status: DC
Start: 2020-10-11 — End: 2021-04-25
  Filled 2020-11-22: qty 30, 30d supply, fill #0
  Filled 2021-02-21: qty 30, 30d supply, fill #1
  Filled 2021-04-25: qty 30, 30d supply, fill #2

## 2020-11-22 MED ORDER — METOPROLOL SUCCINATE ER 50 MG PO TB24
50.0000 mg | ORAL_TABLET | Freq: Every day | ORAL | 0 refills | Status: DC
Start: 2020-10-11 — End: 2021-10-16
  Filled 2020-11-22: qty 30, 30d supply, fill #0
  Filled 2021-04-25: qty 30, 30d supply, fill #1
  Filled 2021-06-04: qty 30, 30d supply, fill #2
  Filled 2021-07-24: qty 30, 30d supply, fill #3
  Filled 2021-08-22: qty 30, 30d supply, fill #0
  Filled 2021-08-22: qty 30, 30d supply, fill #4

## 2020-11-22 MED FILL — Spironolactone Tab 25 MG: ORAL | 30 days supply | Qty: 30 | Fill #0 | Status: AC

## 2020-11-22 MED FILL — Magnesium Oxide Tab 400 MG: ORAL | 30 days supply | Qty: 30 | Fill #0 | Status: AC

## 2020-11-22 MED FILL — Sildenafil Citrate Tab 20 MG: ORAL | 30 days supply | Qty: 30 | Fill #0 | Status: AC

## 2020-11-23 ENCOUNTER — Ambulatory Visit (INDEPENDENT_AMBULATORY_CARE_PROVIDER_SITE_OTHER): Payer: Self-pay | Admitting: Primary Care

## 2020-11-23 ENCOUNTER — Other Ambulatory Visit: Payer: Self-pay

## 2020-11-23 ENCOUNTER — Encounter (INDEPENDENT_AMBULATORY_CARE_PROVIDER_SITE_OTHER): Payer: Self-pay | Admitting: Primary Care

## 2020-11-23 VITALS — BP 140/87 | HR 72 | Temp 97.2°F | Ht 71.0 in | Wt 193.6 lb

## 2020-11-23 DIAGNOSIS — E1159 Type 2 diabetes mellitus with other circulatory complications: Secondary | ICD-10-CM

## 2020-11-23 DIAGNOSIS — Z131 Encounter for screening for diabetes mellitus: Secondary | ICD-10-CM

## 2020-11-23 DIAGNOSIS — E1140 Type 2 diabetes mellitus with diabetic neuropathy, unspecified: Secondary | ICD-10-CM

## 2020-11-23 DIAGNOSIS — L409 Psoriasis, unspecified: Secondary | ICD-10-CM

## 2020-11-23 LAB — POCT GLYCOSYLATED HEMOGLOBIN (HGB A1C): Hemoglobin A1C: 8.3 % — AB (ref 4.0–5.6)

## 2020-11-23 MED ORDER — GLIPIZIDE 10 MG PO TABS
10.0000 mg | ORAL_TABLET | Freq: Two times a day (BID) | ORAL | 3 refills | Status: DC
  Filled 2020-11-23: qty 60, 30d supply, fill #0
  Filled 2021-04-25: qty 60, 30d supply, fill #1

## 2020-11-23 MED ORDER — GABAPENTIN 100 MG PO CAPS
100.0000 mg | ORAL_CAPSULE | Freq: Three times a day (TID) | ORAL | 3 refills | Status: DC
  Filled 2020-11-23: qty 90, 30d supply, fill #0
  Filled 2021-02-22: qty 90, 30d supply, fill #1
  Filled 2021-05-14: qty 90, 30d supply, fill #2
  Filled 2021-08-07: qty 90, 30d supply, fill #3

## 2020-11-23 MED ORDER — METFORMIN HCL 1000 MG PO TABS
1000.0000 mg | ORAL_TABLET | Freq: Two times a day (BID) | ORAL | 3 refills | Status: DC
  Filled 2020-11-23: qty 180, 90d supply, fill #0
  Filled 2020-11-27: qty 60, 30d supply, fill #0

## 2020-11-23 NOTE — Progress Notes (Signed)
Established Patient Office Visit  Subjective:  Patient ID: Casey Bell, male    DOB: 06/22/1956  Age: 65 y.o. MRN: [id number]  CC:  Chief Complaint  Patient presents with  . Numbness    In fingertips/toes    HPI [name] is a 65 year old male who has been battling with psoriasis for quite a while referred to dermatology and no appointments or calls returned presents for numbness and tingling bilateral finger tips and toes   Past Medical History:  Diagnosis Date  . Diabetes mellitus without complication (HCC)   . Psoriasis .  [name] Psoriasis     No past surgical history on file.  Family History  Problem Relation Age of Onset  . Chronic Renal Failure Mother   . Heart failure Mother     Social History   Socioeconomic History  . Marital status: Divorced    Spouse name: Not on file  . Number of children: Not on file  . Years of education: Not on file  . Highest education level: Not on file  Occupational History  . Not on file  Tobacco Use  . Smoking status: Former Smoker    Quit date: 2010    Years since quitting: 12.2  . Smokeless tobacco: Never Used  Substance and Sexual Activity  . Alcohol use: Yes    Comment: few/week  . Drug use: Yes    Types: Marijuana  . Sexual activity: Not Currently  Other Topics Concern  . Not on file  Social History Narrative  . Not on file   Social Determinants of Health   Financial Resource Strain: Not on file  Food Insecurity: Not on file  Transportation Needs: Not on file  Physical Activity: Not on file  Stress: Not on file  Social Connections: Not on file  Intimate Partner Violence: Not on file    Outpatient Medications Prior to Visit  Medication Sig Dispense Refill  . acetaminophen (TYLENOL) 500 MG tablet Take 500 mg by mouth every 6 (six) hours as needed for moderate pain.    [name] apixaban (ELIQUIS) 5 MG TABS tablet Take 1 tablet (5 mg total) by mouth 2 (two) times daily. 60 tablet 5  . Clobetasol  Propionate 0.05 % lotion Apply 118 mLs topically 2 (two) times daily. 59 mL 1  . Clobetasol Propionate 0.05 % shampoo Apply 0.5 mLs topically 2 (two) times daily. 118 mL 2  . dapagliflozin propanediol (FARXIGA) 10 MG TABS tablet TAKE 1 TABLET (10 MG TOTAL) BY MOUTH DAILY BEFORE BREAKFAST. 90 tablet 3  . Emollient (LUBRIDERM) LOTN Apply 15 application topically in the morning and at bedtime. 240 mL 0  . furosemide (LASIX) 40 MG tablet TAKE 1 TABLET (40 MG TOTAL) BY MOUTH 2 (TWO) TIMES DAILY. 180 tablet 2  . lisinopril (ZESTRIL) 2.5 MG tablet Take 1 tablet (2.5 mg total) by mouth daily. 210 tablet 0  . magnesium oxide (MAG-OX) 400 MG tablet TAKE 1 TABLET (400 MG TOTAL) BY MOUTH DAILY. 30 tablet 11  . Magnesium Oxide 400 (240 Mg) MG TABS Take 1 tablet (400 mg total) by mouth daily. 30 tablet 11  . metFORMIN (GLUCOPHAGE) 500 MG tablet Take 1 tablet (500 mg total) by mouth 2 (two) times daily with a meal.    . metoprolol succinate (TOPROL-XL) 50 MG 24 hr tablet Take 50 mg by mouth daily. Take with or immediately following a meal.    . metoprolol succinate (TOPROL-XL) 50 MG 24 hr tablet Take 1 tablet (  50 mg total) by mouth daily. 210 tablet 0  . sildenafil (REVATIO) 20 MG tablet TAKE 2-5 TABLETS ONCE DAILY AS NEEDED PRIOR TO SEXUAL ACTIVITY. 30 tablet 1  . spironolactone (ALDACTONE) 25 MG tablet TAKE 1 TABLET (25 MG TOTAL) BY MOUTH DAILY. 90 tablet 3  . triamcinolone cream (KENALOG) 0.1 % Apply 1 application topically 2 (two) times daily. Use 5 to 10 cc, with each application, from the neck to the toes.  Apply this with Lubriderm to extend the treatment to a bigger part of your body. 120 g 0  . lisinopril (ZESTRIL) 2.5 MG tablet Take 2.5 mg by mouth daily.     No facility-administered medications prior to visit.    No Known Allergies  ROS Review of Systems  Skin: Positive for color change and rash.  Neurological: Positive for weakness and numbness.  All other systems reviewed and are  negative.     Objective:    Physical Exam Vitals reviewed.  Constitutional:      Appearance: Normal appearance.  HENT:     Head: Normocephalic.     Right Ear: External ear normal.     Left Ear: External ear normal.     Nose: Nose normal.  Eyes:     Extraocular Movements: Extraocular movements intact.  Cardiovascular:     Rate and Rhythm: Normal rate and regular rhythm.  Pulmonary:     Effort: Pulmonary effort is normal.     Breath sounds: Normal breath sounds.  Abdominal:     General: Bowel sounds are normal. There is distension.     Palpations: Abdomen is soft.  Musculoskeletal:        General: Normal range of motion.     Cervical back: Normal range of motion and neck supple.  Skin:    General: Skin is warm and dry.  Neurological:     Mental Status: He is alert and oriented to person, place, and time.  Psychiatric:        Mood and Affect: Mood normal.        Thought Content: Thought content normal.        Judgment: Judgment normal.     BP 140/87 (BP Location: Right Arm, Patient Position: Sitting, Cuff Size: Normal)   Pulse 72   Temp (!) 97.2 F (36.2 C) (Temporal)   Ht 5\' 11"  (1.803 m)   Wt 193 lb 9.6 oz (87.8 kg)   SpO2 97%   BMI 27.00 kg/m  Wt Readings from Last 3 Encounters:  11/23/20 193 lb 9.6 oz (87.8 kg)  10/30/20 198 lb (89.8 kg)  07/27/20 200 lb (90.7 kg)     Health Maintenance Due  Topic Date Due  . FOOT EXAM  Never done  . OPHTHALMOLOGY EXAM  Never done  . Fecal DNA (Cologuard)  Never done    There are no preventive care reminders to display for this patient.  Lab Results  Component Value Date   TSH 2.354 11/10/2009   Lab Results  Component Value Date   WBC 5.0 03/28/2020   HGB 13.5 03/28/2020   HCT 41.8 03/28/2020   MCV 88.6 03/28/2020   PLT 189 03/28/2020   Lab Results  Component Value Date   NA 131 (L) 10/30/2020   K 4.2 10/30/2020   CO2 19 (L) 10/30/2020   GLUCOSE 143 (H) 10/30/2020   BUN 19 10/30/2020   CREATININE  1.19 10/30/2020   BILITOT 1.5 (H) 03/28/2020   ALKPHOS 106 03/28/2020   AST 45 (H)  03/28/2020   ALT 28 03/28/2020   PROT 7.2 03/28/2020   ALBUMIN 3.0 (L) 03/28/2020   CALCIUM 9.4 10/30/2020   ANIONGAP 10 03/28/2020   Lab Results  Component Value Date   CHOL 205 (H) 04/02/2010   Lab Results  Component Value Date   HDL 42 04/02/2010   Lab Results  Component Value Date   LDLCALC 119 (H) 04/02/2010   Lab Results  Component Value Date   TRIG 218 (H) 04/02/2010   Lab Results  Component Value Date   CHOLHDL 4.9 Ratio 04/02/2010   Lab Results  Component Value Date   HGBA1C 8.3 (A) 11/23/2020      Assessment & Plan:   Levester was seen today for numbness.  Diagnoses and all orders for this visit:  Screening for diabetes mellitus -     HgB A1c 8.3   Psoriasis Psoriasis is a common, chronic, genetic, systemic inflammatory disease that is characterized by symptoms and signs such as elevated itchy plaques of raised red skin covered with thick silvery scales. Psoriasis is usually found on the elbows, knees, and scalp but can often affect the legs, trunk, and nails- referral placed 2/22  Type 2 diabetes mellitus with other circulatory complication, without long-term current use of insulin (HCC) (new dx)  Goal of therapy: Less than 6.5 hemoglobin A1c.  Reference clinical practice recommendations.decreasing foods that are high in carbohydrates are the following rice, potatoes, breads, sugars, and pastas.  Reduction in the intake (eating) will assist in lowering your blood sugars. -     metFORMIN (GLUCOPHAGE) 1000 MG tablet; Take 1 tablet (1,000 mg total) by mouth 2 (two) times daily with a meal. -     glipiZIDE (GLUCOTROL) 10 MG tablet; Take 1 tablet (10 mg total) by mouth 2 (two) times daily before a meal.  Type 2 diabetes mellitus with diabetic neuropathy, without long-term current use of insulin (HCC) -     gabapentin (NEURONTIN) 100 MG capsule; Take 1 capsule (100 mg total) by  mouth 3 (three) times daily.   No orders of the defined types were placed in this encounter.   Follow-up: No follow-ups on file.    [name], NP

## 2020-11-23 NOTE — Patient Instructions (Signed)
Diabetes Mellitus and Nutrition, Adult When you have diabetes, or diabetes mellitus, it is very important to have healthy eating habits because your blood sugar (glucose) levels are greatly affected by what you eat and drink. Eating healthy foods in the right amounts, at about the same times every day, can help you:  Control your blood glucose.  Lower your risk of heart disease.  Improve your blood pressure.  Reach or maintain a healthy weight. What can affect my meal plan? Every person with diabetes is different, and each person has different needs for a meal plan. Your health care provider may recommend that you work with a dietitian to make a meal plan that is best for you. Your meal plan may vary depending on factors such as:  The calories you need.  The medicines you take.  Your weight.  Your blood glucose, blood pressure, and cholesterol levels.  Your activity level.  Other health conditions you have, such as heart or kidney disease. How do carbohydrates affect me? Carbohydrates, also called carbs, affect your blood glucose level more than any other type of food. Eating carbs naturally raises the amount of glucose in your blood. Carb counting is a method for keeping track of how many carbs you eat. Counting carbs is important to keep your blood glucose at a healthy level, especially if you use insulin or take certain oral diabetes medicines. It is important to know how many carbs you can safely have in each meal. This is different for every person. Your dietitian can help you calculate how many carbs you should have at each meal and for each snack. How does alcohol affect me? Alcohol can cause a sudden decrease in blood glucose (hypoglycemia), especially if you use insulin or take certain oral diabetes medicines. Hypoglycemia can be a life-threatening condition. Symptoms of hypoglycemia, such as sleepiness, dizziness, and confusion, are similar to symptoms of having too much  alcohol.  Do not drink alcohol if: ? Your health care provider tells you not to drink. ? You are pregnant, may be pregnant, or are planning to become pregnant.  If you drink alcohol: ? Do not drink on an empty stomach. ? Limit how much you use to:  0-1 drink a day for women.  0-2 drinks a day for men. ? Be aware of how much alcohol is in your drink. In the [place] one drink equals one 12 oz bottle of beer (355 mL), one 5 oz glass of wine (148 mL), or one 1 oz glass of hard liquor (44 mL). ? Keep yourself hydrated with water, diet soda, or unsweetened iced tea.  Keep in mind that regular soda, juice, and other mixers may contain a lot of sugar and must be counted as carbs. What are tips for following this plan? Reading food labels  Start by checking the serving size on the "Nutrition Facts" label of packaged foods and drinks. The amount of calories, carbs, fats, and other nutrients listed on the label is based on one serving of the item. Many items contain more than one serving per package.  Check the total grams (g) of carbs in one serving. You can calculate the number of servings of carbs in one serving by dividing the total carbs by 15. For example, if a food has 30 g of total carbs per serving, it would be equal to 2 servings of carbs.  Check the number of grams (g) of saturated fats and trans fats in one serving. Choose foods that have   a low amount or none of these fats.  Check the number of milligrams (mg) of salt (sodium) in one serving. Most people should limit total sodium intake to less than 2,300 mg per day.  Always check the nutrition information of foods labeled as "low-fat" or "nonfat." These foods may be higher in added sugar or refined carbs and should be avoided.  Talk to your dietitian to identify your daily goals for nutrients listed on the label. Shopping  Avoid buying canned, pre-made, or processed foods. These foods tend to be high in fat, sodium, and added  sugar.  Shop around the outside edge of the grocery store. This is where you will most often find fresh fruits and vegetables, bulk grains, fresh meats, and fresh dairy. Cooking  Use low-heat cooking methods, such as baking, instead of high-heat cooking methods like deep frying.  Cook using healthy oils, such as olive, canola, or sunflower oil.  Avoid cooking with butter, cream, or high-fat meats. Meal planning  Eat meals and snacks regularly, preferably at the same times every day. Avoid going long periods of time without eating.  Eat foods that are high in fiber, such as fresh fruits, vegetables, beans, and whole grains. Talk with your dietitian about how many servings of carbs you can eat at each meal.  Eat 4-6 oz (112-168 g) of lean protein each day, such as lean meat, chicken, fish, eggs, or tofu. One ounce (oz) of lean protein is equal to: ? 1 oz (28 g) of meat, chicken, or fish. ? 1 egg. ?  cup (62 g) of tofu.  Eat some foods each day that contain healthy fats, such as avocado, nuts, seeds, and fish.   What foods should I eat? Fruits Berries. Apples. Oranges. Peaches. Apricots. Plums. Grapes. Mango. Papaya. Pomegranate. Kiwi. Cherries. Vegetables Lettuce. Spinach. Leafy greens, including kale, chard, collard greens, and mustard greens. Beets. Cauliflower. Cabbage. Broccoli. Carrots. Green beans. Tomatoes. Peppers. Onions. Cucumbers. Brussels sprouts. Grains Whole grains, such as whole-wheat or whole-grain bread, crackers, tortillas, cereal, and pasta. Unsweetened oatmeal. Quinoa. Brown or wild rice. Meats and other proteins Seafood. Poultry without skin. Lean cuts of poultry and beef. Tofu. Nuts. Seeds. Dairy Low-fat or fat-free dairy products such as milk, yogurt, and cheese. The items listed above may not be a complete list of foods and beverages you can eat. Contact a dietitian for more information. What foods should I avoid? Fruits Fruits canned with  syrup. Vegetables Canned vegetables. Frozen vegetables with butter or cream sauce. Grains Refined white flour and flour products such as bread, pasta, snack foods, and cereals. Avoid all processed foods. Meats and other proteins Fatty cuts of meat. Poultry with skin. Breaded or fried meats. Processed meat. Avoid saturated fats. Dairy Full-fat yogurt, cheese, or milk. Beverages Sweetened drinks, such as soda or iced tea. The items listed above may not be a complete list of foods and beverages you should avoid. Contact a dietitian for more information. Questions to ask a health care provider  Do I need to meet with a diabetes educator?  Do I need to meet with a dietitian?  What number can I call if I have questions?  When are the best times to check my blood glucose? Where to find more information:  American Diabetes Association: diabetes.org  Academy of Nutrition and Dietetics: www.eatright.org  National Institute of Diabetes and Digestive and Kidney Diseases: www.niddk.nih.gov  Association of Diabetes Care and Education Specialists: www.diabeteseducator.org Summary  It is important to have healthy eating   habits because your blood sugar (glucose) levels are greatly affected by what you eat and drink.  A healthy meal plan will help you control your blood glucose and maintain a healthy lifestyle.  Your health care provider may recommend that you work with a dietitian to make a meal plan that is best for you.  Keep in mind that carbohydrates (carbs) and alcohol have immediate effects on your blood glucose levels. It is important to count carbs and to use alcohol carefully. This information is not intended to replace advice given to you by your health care provider. Make sure you discuss any questions you have with your health care provider. Document Revised: 07/06/2019 Document Reviewed: 07/06/2019 Elsevier Patient Education  2021 Elsevier Inc.  

## 2020-11-24 ENCOUNTER — Ambulatory Visit (HOSPITAL_COMMUNITY): Payer: Self-pay

## 2020-11-27 ENCOUNTER — Other Ambulatory Visit: Payer: Self-pay

## 2020-11-28 ENCOUNTER — Other Ambulatory Visit: Payer: Self-pay

## 2020-12-02 MED FILL — Furosemide Tab 40 MG: ORAL | 30 days supply | Qty: 60 | Fill #0 | Status: AC

## 2020-12-04 ENCOUNTER — Other Ambulatory Visit: Payer: Self-pay

## 2020-12-04 MED FILL — Dapagliflozin Propanediol Tab 10 MG (Base Equivalent): ORAL | 30 days supply | Qty: 30 | Fill #0 | Status: AC

## 2020-12-06 ENCOUNTER — Other Ambulatory Visit: Payer: Self-pay

## 2020-12-22 ENCOUNTER — Other Ambulatory Visit: Payer: Self-pay

## 2020-12-22 ENCOUNTER — Ambulatory Visit (HOSPITAL_COMMUNITY): Payer: Self-pay | Attending: Cardiovascular Disease

## 2020-12-22 DIAGNOSIS — I5020 Unspecified systolic (congestive) heart failure: Secondary | ICD-10-CM | POA: Insufficient documentation

## 2020-12-22 LAB — ECHOCARDIOGRAM COMPLETE
Area-P 1/2: 4.21 cm2
S' Lateral: 3.2 cm

## 2020-12-28 ENCOUNTER — Other Ambulatory Visit: Payer: Self-pay

## 2021-01-15 ENCOUNTER — Other Ambulatory Visit: Payer: Self-pay

## 2021-02-21 MED FILL — Magnesium Oxide Tab 400 MG: ORAL | 30 days supply | Qty: 30 | Fill #1 | Status: AC

## 2021-02-22 ENCOUNTER — Encounter (INDEPENDENT_AMBULATORY_CARE_PROVIDER_SITE_OTHER): Payer: Self-pay | Admitting: Primary Care

## 2021-02-22 ENCOUNTER — Other Ambulatory Visit: Payer: Self-pay

## 2021-02-22 ENCOUNTER — Ambulatory Visit (INDEPENDENT_AMBULATORY_CARE_PROVIDER_SITE_OTHER): Payer: Self-pay | Admitting: Primary Care

## 2021-02-22 VITALS — BP 104/63 | HR 86 | Temp 97.7°F | Ht 71.0 in | Wt 190.6 lb

## 2021-02-22 DIAGNOSIS — Z1211 Encounter for screening for malignant neoplasm of colon: Secondary | ICD-10-CM

## 2021-02-22 DIAGNOSIS — E1140 Type 2 diabetes mellitus with diabetic neuropathy, unspecified: Secondary | ICD-10-CM

## 2021-02-22 DIAGNOSIS — L409 Psoriasis, unspecified: Secondary | ICD-10-CM

## 2021-02-22 DIAGNOSIS — I4891 Unspecified atrial fibrillation: Secondary | ICD-10-CM

## 2021-02-22 LAB — POCT GLYCOSYLATED HEMOGLOBIN (HGB A1C): Hemoglobin A1C: 6.5 % — AB (ref 4.0–5.6)

## 2021-02-22 MED ORDER — METFORMIN HCL ER 500 MG PO TB24
500.0000 mg | ORAL_TABLET | Freq: Every day | ORAL | 1 refills | Status: DC
  Filled 2021-02-22: qty 30, 30d supply, fill #0
  Filled 2021-04-25: qty 30, 30d supply, fill #1
  Filled 2021-06-04: qty 30, 30d supply, fill #2
  Filled 2021-07-24: qty 30, 30d supply, fill #3
  Filled 2021-08-22: qty 30, 30d supply, fill #4
  Filled 2021-08-22: qty 30, 30d supply, fill #0
  Filled 2021-10-16: qty 30, 30d supply, fill #1

## 2021-02-22 MED FILL — Dapagliflozin Propanediol Tab 10 MG (Base Equivalent): ORAL | 30 days supply | Qty: 30 | Fill #1 | Status: AC

## 2021-02-22 MED FILL — Furosemide Tab 40 MG: ORAL | 30 days supply | Qty: 60 | Fill #1 | Status: AC

## 2021-02-22 NOTE — Progress Notes (Signed)
[facility]     Casey Bell is a 65 y.o. male who presents for an follow-up evaluation of Type 2 diabetes mellitus.  Current symptoms/problems include none and have been Current diabetic medications include oral agents (triple therapy): glipizide (Glucotrol).  Metformin and FARXIGA  The patient was initially diagnosed with Type 2 diabetes mellitus based on the following criteria: ADA guidelines.  Current monitoring regimen: none Home blood sugar records:  None Any episodes of hypoglycemia? no  Known diabetic complications: none Cardiovascular risk factors: advanced age (older than 64 for men, 68 for women), diabetes mellitus, dyslipidemia, hypertension, male gender, and obesity (BMI >= 30 kg/m2) Eye exam current (within one year):    Weight trend: stable Prior visit with CDE: Yes Current diet: in general, a "healthy" diet   Current exercise: walking Medication Compliance?  Yes   Is He on ACE inhibitor or angiotensin II receptor blocker?  Yes  lisinopril (generic)   Review of Systems  All other systems reviewed and are negative.  Objective:    BP 104/63 (BP Location: Right Arm, Patient Position: Sitting, Cuff Size: Normal)   Pulse 86   Temp 97.7 F (36.5 C) (Temporal)   Ht 5\' 11"  (1.803 m)   Wt 190 lb 9.6 oz (86.5 kg)   SpO2 95%   BMI 26.58 kg/m   Physical Exam   Lab Review Glucose (mg/dL)  Date Value  143 (H)  08/07/2020 213 (H)  07/24/2020 254 (H)   Glucose, Bld (mg/dL)  Date Value  07/26/2020 89  03/26/2020 156 (H)  01/27/2020 126 (H)   CO2 (mmol/L)  Date Value  10/30/2020 19 (L)  08/07/2020 23  07/24/2020 25   BUN (mg/dL)  Date Value  07/26/2020 19  08/07/2020 12  07/24/2020 13   Creatinine, Ser (mg/dL)  Date Value  07/26/2020 1.19  08/07/2020 1.06  07/24/2020 0.99      Assessment:    Diabetes Mellitus type II, under excellent control.   Owin was seen today for diabetes and psoriasis.  Diagnoses  and all orders for this visit:  Type 2 diabetes mellitus with diabetic neuropathy, without long-term current use of insulin (HCC) -     HgB A1c -     Comprehensive metabolic panel -     Lipid panel  Colon cancer screening -     Fecal occult blood, imunochemical; Future      Plan:    1.  Rx changes: none  2.  Education: Reviewed 'ABCs' of diabetes management (respective goals in parentheses):  A1C (<7), blood pressure (<130/80), and cholesterol (LDL <100). Counseled Patient on the risk factors of co- morbidities with uncontrol diabetes  Complications -diabetic retinopathy, (close your eyes ? What do you see nothing) nephropathy decrease in kidney function- can lead to dialysis-on a machine 3 days a week to filter your kidney, neuropathy- numbness and tinging in your hands and feet,  increase risk of heart attack and stroke, and amputation due to decrease wound healing and circulation. Decrease your risk by taking medication daily as prescribed, monitor carbohydrates- foods that are high in carbohydrates are the following rice, potatoes, breads, sugars, and pastas.  Reduction in the intake (eating) will assist in lowering your blood sugars. Exercise daily at least 30 minutes daily.   3. CHO counting diet discussed.  Reviewed CHO amount in various foods and how to read nutrition labels.  Discussed recommended serving sizes.   4.  Recommend check BG 0  times a day  5. Recommended increase physical activity - goal is 150 minutes per week  Type 2 diabetes mellitus with diabetic neuropathy, without long-term current use of insulin (HCC) -     HgB A1c -     Comprehensive metabolic panel -     Lipid panel -     metFORMIN (GLUCOPHAGE XR) 500 MG 24 hr tablet; Take 1 tablet (500 mg total) by mouth daily with breakfast.  Colon cancer screening -     Fecal occult blood, imunochemical; Future  Psoriasis Requesting referral for treatment explained uninsured and treatment of medication will be  very expensive.  Reviewed several medications and price listing for treatment of psoriasis.  Patient decided was not able to afford medication however when he is improving or assistance referral will be made  Atrial fibrillation with [facility]) Managed and followed by cardiology   This note has been created with Dragon speech recognition software and Paediatric nurse. Any transcriptional errors are unintentional.   [name], NP 02/22/2021, 11:01 AM

## 2021-02-23 ENCOUNTER — Other Ambulatory Visit: Payer: Self-pay

## 2021-02-23 LAB — COMPREHENSIVE METABOLIC PANEL
ALT: 9 IU/L (ref 0–44)
AST: 19 IU/L (ref 0–40)
Albumin/Globulin Ratio: 1.4 (ref 1.2–2.2)
Albumin: 4.3 g/dL (ref 3.8–4.8)
Alkaline Phosphatase: 115 IU/L (ref 44–121)
BUN/Creatinine Ratio: 8 — ABNORMAL LOW (ref 10–24)
BUN: 11 mg/dL (ref 8–27)
Bilirubin Total: 0.2 mg/dL (ref 0.0–1.2)
CO2: 23 mmol/L (ref 20–29)
Calcium: 9.1 mg/dL (ref 8.6–10.2)
Chloride: 97 mmol/L (ref 96–106)
Creatinine, Ser: 1.46 mg/dL — ABNORMAL HIGH (ref 0.76–1.27)
Globulin, Total: 3 g/dL (ref 1.5–4.5)
Glucose: 200 mg/dL — ABNORMAL HIGH (ref 65–99)
Potassium: 3.9 mmol/L (ref 3.5–5.2)
Sodium: 138 mmol/L (ref 134–144)
Total Protein: 7.3 g/dL (ref 6.0–8.5)
eGFR: 54 mL/min/{1.73_m2} — ABNORMAL LOW (ref >59)

## 2021-02-23 LAB — LIPID PANEL
Chol/HDL Ratio: 5 ratio (ref 0.0–5.0)
Cholesterol, Total: 219 mg/dL — ABNORMAL HIGH (ref 100–199)
HDL: 44 mg/dL (ref >39)
LDL Chol Calc (NIH): 143 mg/dL — ABNORMAL HIGH (ref 0–99)
Triglycerides: 175 mg/dL — ABNORMAL HIGH (ref 0–149)
VLDL Cholesterol Cal: 32 mg/dL (ref 5–40)

## 2021-02-26 ENCOUNTER — Other Ambulatory Visit (INDEPENDENT_AMBULATORY_CARE_PROVIDER_SITE_OTHER): Payer: Self-pay | Admitting: Primary Care

## 2021-02-26 DIAGNOSIS — E782 Mixed hyperlipidemia: Secondary | ICD-10-CM

## 2021-02-26 MED ORDER — ROSUVASTATIN CALCIUM 40 MG PO TABS
40.0000 mg | ORAL_TABLET | Freq: Every day | ORAL | 3 refills | Status: DC
  Filled 2021-02-26: qty 90, 90d supply, fill #0
  Filled 2021-07-03 – 2021-07-12 (×2): qty 30, 30d supply, fill #1
  Filled 2021-08-22: qty 30, 30d supply, fill #2
  Filled 2021-08-22: qty 30, 30d supply, fill #0
  Filled 2021-10-16 (×2): qty 30, 30d supply, fill #1
  Filled 2021-11-20: qty 30, 30d supply, fill #2

## 2021-02-27 ENCOUNTER — Other Ambulatory Visit: Payer: Self-pay

## 2021-04-04 ENCOUNTER — Other Ambulatory Visit: Payer: Self-pay

## 2021-04-12 ENCOUNTER — Telehealth: Payer: Self-pay

## 2021-04-12 NOTE — Telephone Encounter (Signed)
Patient in need of CPAP machine and he is currently uninsured and unable to afford the cost of a new CPAP machine.    Informed him about the [employer] (ASAA) CPAP Assistance Program now that it is up and running again post COVID.   They provide refurbished machines to individuals for a $100 program fee. There is no warranty or technical support that comes with the machine, it is provided " as is."    The program currently reports a wait of about 3.5-4 months for a machine to be available for delivery. The patient said that he is able to afford the program fee at this time.  Informed him that [employer] will contact him for payment. If he has difficulty paying at the time the fee is due, [facility] has funding available to assist with this.  He agreed to submitting the order for the CPAP machine to [employer] and have  the machine delivered to [facility] and when  he picks up the machine at [facility], he will be asked to sign a waiver of liability for the ASAA     Application sent to Gwinda Passe, NP for signature.

## 2021-04-24 NOTE — Progress Notes (Signed)
Cardiology Office Note:    Date:  04/25/2021   ID:  Casey Bell, DOB Dec 04, 1955, MRN [id number]  PCP:  [name], NP  [facility] Cardiologist:  Christell Constant, MD  [facility] Electrophysiologist:  None   Referring MD: [name], NP   CC: Follow up HF  History of Present Illness:    Casey Bell is a 65 y.o. male with a hx of AFl 2:1, CHADSVASC 2, Former Alcohol Use (down from a couple beers),OSA now no CPAP, DM with HTN, Psoriasis  who presented for evaluation of atrial flutter 04/27/20.Since starting on GDMT improvement BNP has improved.  Seen In interim of this visit, patient stopped taking potassium; but has the orange card.  Notes that his psoriasis has been acting up.  In interim of this past visit, patient has had improvement in his LVEF.  Seen 04/25/21.  Patient notes that he is doing well except for his psoriasis.  Since last visit no issues.  Relevant interval testing or therapy include repeat echo with improved LVEF.  There are no interval hospital/ED visit.    No chest pain or pressure.  No SOB/DOE and no PND/Orthopnea.  No weight gain or leg swelling.  No palpitations or syncope .  Ambulatory blood pressure 120/70-80.   Past Medical History:  Diagnosis Date   Diabetes mellitus without complication (HCC)    Psoriasis .   Psoriasis     No past surgical history on file.  Current Medications: Current Meds  Medication Sig   acetaminophen (TYLENOL) 500 MG tablet Take 500 mg by mouth every 6 (six) hours as needed for moderate pain.   apixaban (ELIQUIS) 5 MG TABS tablet Take 1 tablet (5 mg total) by mouth 2 (two) times daily.   Clobetasol Propionate 0.05 % lotion Apply 118 mLs topically 2 (two) times daily.   Clobetasol Propionate 0.05 % shampoo Apply 0.5 mLs topically 2 (two) times daily.   dapagliflozin propanediol (FARXIGA) 10 MG TABS tablet TAKE 1 TABLET (10 MG TOTAL) BY MOUTH DAILY BEFORE BREAKFAST.   Emollient (LUBRIDERM)  LOTN Apply 15 application topically in the morning and at bedtime.   furosemide (LASIX) 40 MG tablet Take 1 tablet (40 mg total) by mouth daily.   gabapentin (NEURONTIN) 100 MG capsule Take 1 capsule (100 mg total) by mouth 3 (three) times daily.   glipiZIDE (GLUCOTROL) 10 MG tablet Take 1 tablet (10 mg total) by mouth 2 (two) times daily before a meal.   magnesium oxide (MAG-OX) 400 MG tablet TAKE 1 TABLET (400 MG TOTAL) BY MOUTH DAILY.   metFORMIN (GLUCOPHAGE XR) 500 MG 24 hr tablet Take 1 tablet (500 mg total) by mouth daily with breakfast.   metoprolol succinate (TOPROL-XL) 50 MG 24 hr tablet Take 1 tablet (50 mg total) by mouth daily.   rosuvastatin (CRESTOR) 40 MG tablet Take 1 tablet (40 mg total) by mouth daily.   sildenafil (REVATIO) 20 MG tablet TAKE 2-5 TABLETS ONCE DAILY AS NEEDED PRIOR TO SEXUAL ACTIVITY.   spironolactone (ALDACTONE) 25 MG tablet TAKE 1 TABLET (25 MG TOTAL) BY MOUTH DAILY.   triamcinolone cream (KENALOG) 0.1 % Apply 1 application topically 2 (two) times daily. Use 5 to 10 cc, with each application, from the neck to the toes.  Apply this with Lubriderm to extend the treatment to a bigger part of your body.   [DISCONTINUED] furosemide (LASIX) 40 MG tablet TAKE 1 TABLET (40 MG TOTAL) BY MOUTH 2 (TWO) TIMES DAILY.   [  DISCONTINUED] lisinopril (ZESTRIL) 2.5 MG tablet Take 1 tablet (2.5 mg total) by mouth daily.   [DISCONTINUED] potassium chloride (KLOR-CON 10) 10 MEQ tablet Take 1 tablet (10 mEq total) by mouth daily.     Allergies:   Patient has no known allergies.   Social History   Socioeconomic History   Marital status: Divorced    Spouse name: Not on file   Number of children: Not on file   Years of education: Not on file   Highest education level: Not on file  Occupational History   Not on file  Tobacco Use   Smoking status: Former    Types: Cigarettes    Quit date: 2010    Years since quitting: 12.7   Smokeless tobacco: Never  Substance and Sexual  Activity   Alcohol use: Yes    Comment: few/week   Drug use: Yes    Types: Marijuana   Sexual activity: Not Currently  Other Topics Concern   Not on file  Social History Narrative   Not on file   Social Determinants of Health   Financial Resource Strain: Not on file  Food Insecurity: Not on file  Transportation Needs: Not on file  Physical Activity: Not on file  Stress: Not on file  Social Connections: Not on file    Social: Retired in October, has had insurance issues since  Family History: The patient's family history includes Chronic Renal Failure in his mother; Heart failure in his mother. Sister CHF Mother CHF  ROS:   Please see the history of present illness.     All other systems reviewed and are negative.  EKGs/Labs/Other Studies Reviewed:    The following studies were reviewed today:  EKG:   04/25/2021: SR rate 94 rare PAC and PVCs 07/27/20 SR rate 77 with Occasional PACs, with LAE 04/27/20: Atrial flutter with ventricular rate 69  03/27/20: Atrial flutter with 2:1 conduction rate 135  Transthoracic Echocardiogram: Date: 12/22/20 Results:  1. Compared with the echo 03/2020, systolic function has improved.   2. Left ventricular ejection fraction, by estimation, is 40 to 45%. The  left ventricle has mildly decreased function. The left ventricle has no  regional wall motion abnormalities. Left ventricular diastolic parameters  are consistent with Grade I  diastolic dysfunction (impaired relaxation). Elevated left ventricular  end-diastolic pressure.   3. Right ventricular systolic function is normal. The right ventricular  size is normal. There is normal pulmonary artery systolic pressure.   4. The mitral valve is normal in structure. Trivial mitral valve  regurgitation. No evidence of mitral stenosis.   5. The aortic valve is tricuspid. Aortic valve regurgitation is not  visualized. No aortic stenosis is present.   6. The inferior vena cava is normal in size  with greater than 50%  respiratory variability, suggesting right atrial pressure of 3 mmHg.   Recent Labs: 10/30/2020: Magnesium 1.8; NT-Pro BNP 691 02/22/2021: ALT 9; BUN 11; Creatinine, Ser 1.46; Potassium 3.9; Sodium 138  Recent Lipid Panel    Component Value Date/Time   CHOL 219 (H) 02/22/2021 1056   TRIG 175 (H) 02/22/2021 1056   HDL 44 02/22/2021 1056   CHOLHDL 5.0 02/22/2021 1056   CHOLHDL 4.9 Ratio 04/02/2010 1949   VLDL 44 (H) 04/02/2010 1949   LDLCALC 143 (H) 02/22/2021 1056    Physical Exam:    VS:  BP (!) 80/60 (BP Location: Left Arm, Patient Position: Sitting, Cuff Size: Normal)   Pulse 94   Ht 5\' 11"  (1.803  m)   Wt 187 lb (84.8 kg)   SpO2 96%   BMI 26.08 kg/m     Wt Readings from Last 3 Encounters:  04/25/21 187 lb (84.8 kg)  02/22/21 190 lb 9.6 oz (86.5 kg)  11/23/20 193 lb 9.6 oz (87.8 kg)    Left arm 86/60 Right arm 100/60  GEN: Well nourished, well developed in no acute distress HEENT: Normal NECK: No JVD LYMPHATICS: No lymphadenopathy CARDIAC: RRR, no murmurs, rubs, gallops RESPIRATORY:  Clear to auscultation without rales, wheezing or rhonchi  ABDOMEN: Soft, non-tender, non-distended MUSCULOSKELETAL:  No edema; No deformity  SKIN: skin discoloration; bilateral arm lesions noted stable from prior NEUROLOGIC:  Alert and oriented x 3 PSYCHIATRIC:  Normal affect   ASSESSMENT:    1. HFrEF (heart failure with reduced ejection fraction) (HCC)   2. Typical atrial flutter (HCC)   3. Chronic combined systolic and diastolic heart failure (HCC)   4. Diabetes mellitus without complication (HCC)   5. AKI (acute kidney injury) (HCC)     PLAN:    In order of problems listed above:  Heart Failure Reduced Ejection Fraction  Diabetes Mellitus with Psoriasis (coronary risk factors) - NYHA class I, Stage B, euvolemic, etiology likely AF or alcohol; no ischemic eval prior - Diuretic regimen: Lasix 40 mg PO daily (change) - Metoprolol succinate 50 mg PO  Daily - hold ACEi and get BMP - Aldactone 25 mg- if AKI will stop MRA - Continue Farixga 10 mg PO daily  - has never had BP discrepancy in the past; low threshold for bilateral arterial duplex  Erectile Dysfunction stable on below regimen - Sildenafil and metoprolol hold day of intimacy  Paroxysmal Atrial Flutter  - Risk factors include OSA and former alcohol use - CHADSVASC= 2 . - Continue anticoagulation with Eliquis.  - Continue rate control with metoprolol succinate  Will plan for 304 months follow up unless new symptoms or abnormal test results warranting change in plan  Would be reasonable for  APP Follow up     Medication Adjustments/Labs and Tests Ordered: Current medicines are reviewed at length with the patient today.  Concerns regarding medicines are outlined above.  Orders Placed This Encounter  Procedures   Basic metabolic panel   EKG 12-Lead    Meds ordered this encounter  Medications   furosemide (LASIX) 40 MG tablet    Sig: Take 1 tablet (40 mg total) by mouth daily.    Dispense:  90 tablet    Refill:  3     Patient Instructions  Medication Instructions:  Your physician has recommended you make the following change in your medication:  DECREASE: furosemide to 40 mg once daily STOP: lisinopril   *If you need a refill on your cardiac medications before your next appointment, please call your pharmacy*   Lab Work: TODAY: BMET If you have labs (blood work) drawn today and your tests are completely normal, you will receive your results only by: MyChart Message (if you have MyChart) OR A paper copy in the mail If you have any lab test that is abnormal or we need to change your treatment, we will call you to review the results.   Testing/Procedures: NONE   Follow-Up: At [facility], you and your health needs are our priority.  As part of our continuing mission to provide you with exceptional heart care, we have created designated Provider Care  Teams.  These Care Teams include your primary Cardiologist (physician) and Advanced Practice Providers (APPs -  Physician Assistants and Nurse Practitioners) who all work together to provide you with the care you need, when you need it.    Your next appointment:   3 -4 month(s)  The format for your next appointment:   In Person  Provider:   You may see Christell Constant, MD or one of the following Advanced Practice Providers on your designated Care Team:   Ronie Spies, PA-C Jacolyn Reedy, PA-C       Signed, Christell Constant, MD  04/25/2021 4:01 PM    [facility]

## 2021-04-25 ENCOUNTER — Encounter: Payer: Self-pay | Admitting: Internal Medicine

## 2021-04-25 ENCOUNTER — Other Ambulatory Visit: Payer: Self-pay

## 2021-04-25 ENCOUNTER — Ambulatory Visit (INDEPENDENT_AMBULATORY_CARE_PROVIDER_SITE_OTHER): Payer: Self-pay | Admitting: Internal Medicine

## 2021-04-25 VITALS — BP 80/60 | HR 94 | Ht 71.0 in | Wt 187.0 lb

## 2021-04-25 DIAGNOSIS — I5042 Chronic combined systolic (congestive) and diastolic (congestive) heart failure: Secondary | ICD-10-CM

## 2021-04-25 DIAGNOSIS — I483 Typical atrial flutter: Secondary | ICD-10-CM

## 2021-04-25 DIAGNOSIS — I5020 Unspecified systolic (congestive) heart failure: Secondary | ICD-10-CM

## 2021-04-25 DIAGNOSIS — E119 Type 2 diabetes mellitus without complications: Secondary | ICD-10-CM

## 2021-04-25 DIAGNOSIS — N179 Acute kidney failure, unspecified: Secondary | ICD-10-CM | POA: Insufficient documentation

## 2021-04-25 MED ORDER — FUROSEMIDE 40 MG PO TABS
40.0000 mg | ORAL_TABLET | Freq: Every day | ORAL | 3 refills | Status: DC
  Filled 2021-04-25: qty 90, 90d supply, fill #0
  Filled 2021-04-28: qty 30, 30d supply, fill #0

## 2021-04-25 MED FILL — Furosemide Tab 40 MG: ORAL | 30 days supply | Qty: 60 | Fill #2 | Status: CN

## 2021-04-25 MED FILL — Magnesium Oxide Tab 400 MG: ORAL | 30 days supply | Qty: 30 | Fill #2 | Status: AC

## 2021-04-25 MED FILL — Dapagliflozin Propanediol Tab 10 MG (Base Equivalent): ORAL | 30 days supply | Qty: 30 | Fill #2 | Status: AC

## 2021-04-25 NOTE — Patient Instructions (Signed)
Medication Instructions:  Your physician has recommended you make the following change in your medication:  DECREASE: furosemide to 40 mg once daily STOP: lisinopril   *If you need a refill on your cardiac medications before your next appointment, please call your pharmacy*   Lab Work: TODAY: BMET If you have labs (blood work) drawn today and your tests are completely normal, you will receive your results only by: MyChart Message (if you have MyChart) OR A paper copy in the mail If you have any lab test that is abnormal or we need to change your treatment, we will call you to review the results.   Testing/Procedures: NONE   Follow-Up: At [facility], you and your health needs are our priority.  As part of our continuing mission to provide you with exceptional heart care, we have created designated Provider Care Teams.  These Care Teams include your primary Cardiologist (physician) and Advanced Practice Providers (APPs -  Physician Assistants and Nurse Practitioners) who all work together to provide you with the care you need, when you need it.    Your next appointment:   3 -4 month(s)  The format for your next appointment:   In Person  Provider:   You may see Christell Constant, MD or one of the following Advanced Practice Providers on your designated Care Team:   Ronie Spies, PA-C Jacolyn Reedy, PA-C

## 2021-04-26 ENCOUNTER — Telehealth: Payer: Self-pay

## 2021-04-26 ENCOUNTER — Other Ambulatory Visit: Payer: Self-pay

## 2021-04-26 DIAGNOSIS — N179 Acute kidney failure, unspecified: Secondary | ICD-10-CM

## 2021-04-26 DIAGNOSIS — I5042 Chronic combined systolic (congestive) and diastolic (congestive) heart failure: Secondary | ICD-10-CM

## 2021-04-26 LAB — BASIC METABOLIC PANEL
BUN/Creatinine Ratio: 13 (ref 10–24)
BUN: 25 mg/dL (ref 8–27)
CO2: 24 mmol/L (ref 20–29)
Calcium: 9.6 mg/dL (ref 8.6–10.2)
Chloride: 90 mmol/L — ABNORMAL LOW (ref 96–106)
Creatinine, Ser: 1.86 mg/dL — ABNORMAL HIGH (ref 0.76–1.27)
Glucose: 115 mg/dL — ABNORMAL HIGH (ref 65–99)
Potassium: 4 mmol/L (ref 3.5–5.2)
Sodium: 137 mmol/L (ref 134–144)
eGFR: 40 mL/min/{1.73_m2} — ABNORMAL LOW (ref >59)

## 2021-04-26 NOTE — Telephone Encounter (Signed)
Called pt reviewed results and MD recommendations.  He is agreeable to plan of care.  F/u lab appointment scheduled for 05/03/21.  All questions answered no further questions or concerns.

## 2021-04-26 NOTE — Telephone Encounter (Signed)
-----   Message from Christell Constant, MD sent at 04/26/2021 11:40 AM EDT ----- Hold aldactone repeat in 7-10 days  Christell Constant, MD

## 2021-04-27 ENCOUNTER — Other Ambulatory Visit: Payer: Self-pay

## 2021-04-30 ENCOUNTER — Other Ambulatory Visit: Payer: Self-pay

## 2021-05-03 ENCOUNTER — Other Ambulatory Visit: Payer: Self-pay | Admitting: *Deleted

## 2021-05-03 ENCOUNTER — Other Ambulatory Visit: Payer: Self-pay

## 2021-05-03 DIAGNOSIS — I5042 Chronic combined systolic (congestive) and diastolic (congestive) heart failure: Secondary | ICD-10-CM

## 2021-05-03 DIAGNOSIS — N179 Acute kidney failure, unspecified: Secondary | ICD-10-CM

## 2021-05-04 LAB — BASIC METABOLIC PANEL
BUN/Creatinine Ratio: 10 (ref 10–24)
BUN: 16 mg/dL (ref 8–27)
CO2: 24 mmol/L (ref 20–29)
Calcium: 9.2 mg/dL (ref 8.6–10.2)
Chloride: 92 mmol/L — ABNORMAL LOW (ref 96–106)
Creatinine, Ser: 1.6 mg/dL — ABNORMAL HIGH (ref 0.76–1.27)
Glucose: 116 mg/dL — ABNORMAL HIGH (ref 65–99)
Potassium: 3.8 mmol/L (ref 3.5–5.2)
Sodium: 136 mmol/L (ref 134–144)
eGFR: 48 mL/min/{1.73_m2} — ABNORMAL LOW (ref >59)

## 2021-05-07 ENCOUNTER — Telehealth: Payer: Self-pay

## 2021-05-07 ENCOUNTER — Other Ambulatory Visit: Payer: Self-pay

## 2021-05-07 DIAGNOSIS — I5020 Unspecified systolic (congestive) heart failure: Secondary | ICD-10-CM

## 2021-05-07 MED ORDER — FUROSEMIDE 20 MG PO TABS
20.0000 mg | ORAL_TABLET | Freq: Every day | ORAL | 3 refills | Status: AC
  Filled 2021-05-07: qty 30, 30d supply, fill #0
  Filled 2021-06-04: qty 30, 30d supply, fill #1
  Filled 2021-07-24: qty 30, 30d supply, fill #2
  Filled 2021-08-22: qty 30, 30d supply, fill #3
  Filled 2021-08-22: qty 30, 30d supply, fill #0
  Filled 2021-10-16: qty 30, 30d supply, fill #1
  Filled 2021-11-20: qty 30, 30d supply, fill #2
  Filled 2022-01-01: qty 30, 30d supply, fill #3
  Filled 2022-02-23: qty 30, 30d supply, fill #4

## 2021-05-07 NOTE — Telephone Encounter (Signed)
-----   Message from Christell Constant, MD sent at 05/07/2021  7:45 AM EDT ----- Results: Creatinine has improved off of aldactone Plan: Will decrease lasix to 20 mg PO Daily, BMP in 2-3 weeks  Christell Constant, MD

## 2021-05-07 NOTE — Telephone Encounter (Signed)
Called pt reviewed results and MD recommendations. He is agreeable to plan of care.  Orders placed.

## 2021-05-10 ENCOUNTER — Other Ambulatory Visit: Payer: Self-pay

## 2021-05-15 ENCOUNTER — Other Ambulatory Visit: Payer: Self-pay

## 2021-05-17 ENCOUNTER — Other Ambulatory Visit: Payer: Self-pay

## 2021-05-18 ENCOUNTER — Other Ambulatory Visit: Payer: Self-pay

## 2021-05-24 ENCOUNTER — Other Ambulatory Visit: Payer: Self-pay

## 2021-05-25 ENCOUNTER — Ambulatory Visit (INDEPENDENT_AMBULATORY_CARE_PROVIDER_SITE_OTHER): Payer: Self-pay | Admitting: Primary Care

## 2021-05-29 ENCOUNTER — Telehealth: Payer: Self-pay

## 2021-05-29 NOTE — Telephone Encounter (Signed)
Faxed on 05/24/2021

## 2021-06-04 ENCOUNTER — Other Ambulatory Visit: Payer: Self-pay

## 2021-06-04 MED FILL — Magnesium Oxide Tab 400 MG: ORAL | 30 days supply | Qty: 30 | Fill #3 | Status: AC

## 2021-06-04 MED FILL — Dapagliflozin Propanediol Tab 10 MG (Base Equivalent): ORAL | 30 days supply | Qty: 30 | Fill #3 | Status: AC

## 2021-06-06 ENCOUNTER — Other Ambulatory Visit: Payer: Self-pay

## 2021-06-12 ENCOUNTER — Encounter (INDEPENDENT_AMBULATORY_CARE_PROVIDER_SITE_OTHER): Payer: Self-pay | Admitting: Primary Care

## 2021-06-12 ENCOUNTER — Other Ambulatory Visit: Payer: Self-pay

## 2021-06-12 ENCOUNTER — Ambulatory Visit (INDEPENDENT_AMBULATORY_CARE_PROVIDER_SITE_OTHER): Payer: Self-pay | Admitting: Primary Care

## 2021-06-12 VITALS — BP 120/72 | HR 67 | Temp 97.3°F | Ht 71.0 in | Wt 191.0 lb

## 2021-06-12 DIAGNOSIS — Z23 Encounter for immunization: Secondary | ICD-10-CM

## 2021-06-12 DIAGNOSIS — E1140 Type 2 diabetes mellitus with diabetic neuropathy, unspecified: Secondary | ICD-10-CM

## 2021-06-12 LAB — POCT GLYCOSYLATED HEMOGLOBIN (HGB A1C): Hemoglobin A1C: 5.7 % — AB (ref 4.0–5.6)

## 2021-06-12 NOTE — Patient Instructions (Signed)
Pneumococcal Conjugate Vaccine (Prevnar 20Zoster Vaccine, Recombinant injection What is this medication? ZOSTER VACCINE (ZOS ter vak SEEN) is a vaccine used to reduce the risk of getting shingles. This vaccine is not used to treat shingles or nerve pain from shingles. This medicine may be used for other purposes; ask your health care provider or pharmacist if you have questions. COMMON BRAND NAME(S): [facility] What should I tell my care team before I take this medication? They need to know if you have any of these conditions: cancer immune system problems an unusual or allergic reaction to Zoster vaccine, other medications, foods, dyes, or preservatives pregnant or trying to get pregnant breast-feeding How should I use this medication? This vaccine is injected into a muscle. It is given by a health care provider. A copy of Vaccine Information Statements will be given before each vaccination. Be sure to read this information carefully each time. This sheet may change often. Talk to your health care provider about the use of this vaccine in children. This vaccine is not approved for use in children. Overdosage: If you think you have taken too much of this medicine contact a poison control center or emergency room at once. NOTE: This medicine is only for you. Do not share this medicine with others. What if I miss a dose? Keep appointments for follow-up (booster) doses. It is important not to miss your dose. Call your health care provider if you are unable to keep an appointment. What may interact with this medication? medicines that suppress your immune system medicines to treat cancer steroid medicines like prednisone or cortisone This list may not describe all possible interactions. Give your health care provider a list of all the medicines, herbs, non-prescription drugs, or dietary supplements you use. Also tell them if you smoke, drink alcohol, or use illegal drugs. Some items may interact  with your medicine. What should I watch for while using this medication? Visit your health care provider regularly. This vaccine, like all vaccines, may not fully protect everyone. What side effects may I notice from receiving this medication? Side effects that you should report to your doctor or health care professional as soon as possible: allergic reactions (skin rash, itching or hives; swelling of the face, lips, or tongue) trouble breathing Side effects that usually do not require medical attention (report these to your doctor or health care professional if they continue or are bothersome): chills headache fever nausea pain, redness, or irritation at site where injected tiredness vomiting This list may not describe all possible side effects. Call your doctor for medical advice about side effects. You may report side effects to FDA at 1-800-FDA-1088. Where should I keep my medication? This vaccine is only given by a health care provider. It will not be stored at home. NOTE: This sheet is a summary. It may not cover all possible information. If you have questions about this medicine, talk to your doctor, pharmacist, or health care provider.  2022 Elsevier/Gold Standard (2019-09-03 16:23:07) ) Suspension for Injection What is this medication? PNEUMOCOCCAL VACCINE (NEU mo KOK al vak SEEN) is a vaccine used to prevent pneumococcus bacterial infections. These bacteria can cause serious infections like pneumonia, meningitis, and blood infections. This vaccine will lower your chance of getting pneumonia. If you do get pneumonia, it can make your symptoms milder and your illness shorter. This vaccine will not treat an infection and will not cause infection. This vaccine is recommended for infants and young children, adults with certain medical conditions, and  adults 65 years or older. This medicine may be used for other purposes; ask your health care provider or pharmacist if you have  questions. COMMON BRAND NAME(S): Prevnar, Prevnar 13 What should I tell my care team before I take this medication? They need to know if you have any of these conditions: bleeding problems fever immune system problems an unusual or allergic reaction to pneumococcal vaccine, diphtheria toxoid, other vaccines, latex, other medicines, foods, dyes, or preservatives pregnant or trying to get pregnant breast-feeding How should I use this medication? This vaccine is for injection into a muscle. It is given by a health care professional. A copy of Vaccine Information Statements will be given before each vaccination. Read this sheet carefully each time. The sheet may change frequently. Talk to your pediatrician regarding the use of this medicine in children. While this drug may be prescribed for children as young as 1 weeks old for selected conditions, precautions do apply. Overdosage: If you think you have taken too much of this medicine contact a poison control center or emergency room at once. NOTE: This medicine is only for you. Do not share this medicine with others. What if I miss a dose? It is important not to miss your dose. Call your doctor or health care professional if you are unable to keep an appointment. What may interact with this medication? medicines for cancer chemotherapy medicines that suppress your immune function steroid medicines like prednisone or cortisone This list may not describe all possible interactions. Give your health care provider a list of all the medicines, herbs, non-prescription drugs, or dietary supplements you use. Also tell them if you smoke, drink alcohol, or use illegal drugs. Some items may interact with your medicine. What should I watch for while using this medication? Mild fever and pain should go away in 3 days or less. Report any unusual symptoms to your doctor or health care professional. What side effects may I notice from receiving this  medication? Side effects that you should report to your doctor or health care professional as soon as possible: allergic reactions like skin rash, itching or hives, swelling of the face, lips, or tongue breathing problems confused fast or irregular heartbeat fever over 102 degrees F seizures unusual bleeding or bruising unusual muscle weakness Side effects that usually do not require medical attention (report to your doctor or health care professional if they continue or are bothersome): aches and pains diarrhea fever of 102 degrees F or less headache irritable loss of appetite pain, tender at site where injected trouble sleeping This list may not describe all possible side effects. Call your doctor for medical advice about side effects. You may report side effects to FDA at 1-800-FDA-1088. Where should I keep my medication? This does not apply. This vaccine is given in a clinic, pharmacy, doctor's office, or other health care setting and will not be stored at home. NOTE: This sheet is a summary. It may not cover all possible information. If you have questions about this medicine, talk to your doctor, pharmacist, or health care provider.  2022 Elsevier/Gold Standard (2014-05-05 10:27:27) Influenza, Adult Influenza is also called "the flu." It is an infection in the lungs, nose, and throat (respiratory tract). It spreads easily from person to person (is contagious). The flu causes symptoms that are like a cold, along with high fever and body aches. What are the causes? This condition is caused by the influenza virus. You can get the virus by: Breathing in droplets that are  in the air after a person infected with the flu coughed or sneezed. Touching something that has the virus on it and then touching your mouth, nose, or eyes. What increases the risk? Certain things may make you more likely to get the flu. These include: Not washing your hands often. Having close contact with many  people during cold and flu season. Touching your mouth, eyes, or nose without first washing your hands. Not getting a flu shot every year. You may have a higher risk for the flu, and serious problems, such as a lung infection (pneumonia), if you: Are older than 65. Are pregnant. Have a weakened disease-fighting system (immune system) because of a disease or because you are taking certain medicines. Have a long-term (chronic) condition, such as: Heart, kidney, or lung disease. Diabetes. Asthma. Have a liver disorder. Are very overweight (morbidly obese). Have anemia. What are the signs or symptoms? Symptoms usually begin suddenly and last 4-14 days. They may include: Fever and chills. Headaches, body aches, or muscle aches. Sore throat. Cough. Runny or stuffy (congested) nose. Feeling discomfort in your chest. Not wanting to eat as much as normal. Feeling weak or tired. Feeling dizzy. Feeling sick to your stomach or throwing up. How is this treated? If the flu is found early, you can be treated with antiviral medicine. This can help to reduce how bad the illness is and how long it lasts. This may be given by mouth or through an IV tube. Taking care of yourself at home can help your symptoms get better. Your doctor may want you to: Take over-the-counter medicines. Drink plenty of fluids. The flu often goes away on its own. If you have very bad symptoms or other problems, you may be treated in a hospital. Follow these instructions at home:   Activity Rest as needed. Get plenty of sleep. Stay home from work or school as told by your doctor. Do not leave home until you do not have a fever for 24 hours without taking medicine. Leave home only to go to your doctor. Eating and drinking Take an ORS (oral rehydration solution). This is a drink that is sold at pharmacies and stores. Drink enough fluid to keep your pee pale yellow. Drink clear fluids in small amounts as you are able.  Clear fluids include: Water. Ice chips. Fruit juice mixed with water. Low-calorie sports drinks. Eat bland foods that are easy to digest. Eat small amounts as you are able. These foods include: Bananas. Applesauce. Rice. Lean meats. Toast. Crackers. Do not eat or drink: Fluids that have a lot of sugar or caffeine. Alcohol. Spicy or fatty foods. General instructions Take over-the-counter and prescription medicines only as told by your doctor. Use a cool mist humidifier to add moisture to the air in your home. This can make it easier for you to breathe. When using a cool mist humidifier, clean it daily. Empty water and replace with clean water. Cover your mouth and nose when you cough or sneeze. Wash your hands with soap and water often and for at least 20 seconds. This is also important after you cough or sneeze. If you cannot use soap and water, use alcohol-based hand sanitizer. Keep all follow-up visits. How is this prevented?  Get a flu shot every year. You may get the flu shot in late summer, fall, or winter. Ask your doctor when you should get your flu shot. Avoid contact with people who are sick during fall and winter. This is cold and  flu season. Contact a doctor if: You get new symptoms. You have: Chest pain. Watery poop (diarrhea). A fever. Your cough gets worse. You start to have more mucus. You feel sick to your stomach. You throw up. Get help right away if you: Have shortness of breath. Have trouble breathing. Have skin or nails that turn a bluish color. Have very bad pain or stiffness in your neck. Get a sudden headache. Get sudden pain in your face or ear. Cannot eat or drink without throwing up. These symptoms may represent a serious problem that is an emergency. Get medical help right away. Call your local emergency services (911 in the [place] Do not wait to see if the symptoms will go away. Do not drive yourself to the hospital. Summary Influenza is  also called "the flu." It is an infection in the lungs, nose, and throat. It spreads easily from person to person. Take over-the-counter and prescription medicines only as told by your doctor. Getting a flu shot every year is the best way to not get the flu. This information is not intended to replace advice given to you by your health care provider. Make sure you discuss any questions you have with your health care provider. Document Revised: 03/17/2020 Document Reviewed: 03/17/2020 Elsevier Patient Education  2022 [employer].

## 2021-06-12 NOTE — Progress Notes (Signed)
[facility]   Subjective:  Patient ID: Casey Bell, male    DOB: 12-29-1955  Age: 65 y.o. MRN: [id number]  CC: Diabetes   HPI SHAROD PETSCH presents for follow-up of diabetes. Patient does  check blood sugar at home  Compliant with meds - Yes Checking CBGs? Yes  Fasting avg - 120-150  Postprandial average -  Exercising regularly? - Yes Watching carbohydrate intake? - Yes Neuropathy ? - Yes Hypoglycemic events - No  - Recovers with :   Pertinent ROS:  Polyuria - No Polydipsia - No Vision problems - No  Medications as noted below. Taking them regularly without complication/adverse reaction being reported today.   History Kayle has a past medical history of Diabetes mellitus without complication (HCC), Psoriasis (.), and Psoriasis.   He has no past surgical history on file.   His family history includes Chronic Renal Failure in his mother; Heart failure in his mother.He reports that he quit smoking about 12 years ago. His smoking use included cigarettes. He has never used smokeless tobacco. He reports current alcohol use. He reports current drug use. Drug: Marijuana.  Current Outpatient Medications on File Prior to Visit  Medication Sig Dispense Refill   acetaminophen (TYLENOL) 500 MG tablet Take 500 mg by mouth every 6 (six) hours as needed for moderate pain.     apixaban (ELIQUIS) 5 MG TABS tablet Take 1 tablet (5 mg total) by mouth 2 (two) times daily. 60 tablet 5   Clobetasol Propionate 0.05 % lotion Apply 118 mLs topically 2 (two) times daily. 59 mL 1   Clobetasol Propionate 0.05 % shampoo Apply 0.5 mLs topically 2 (two) times daily. 118 mL 2   dapagliflozin propanediol (FARXIGA) 10 MG TABS tablet TAKE 1 TABLET (10 MG TOTAL) BY MOUTH DAILY BEFORE BREAKFAST. 90 tablet 3   Emollient (LUBRIDERM) LOTN Apply 15 application topically in the morning and at bedtime. 240 mL 0   furosemide (LASIX) 20 MG tablet Take 1 tablet (20 mg total) by mouth daily. 90  tablet 3   gabapentin (NEURONTIN) 100 MG capsule Take 1 capsule (100 mg total) by mouth 3 (three) times daily. 90 capsule 3   magnesium oxide (MAG-OX) 400 MG tablet TAKE 1 TABLET (400 MG TOTAL) BY MOUTH DAILY. 30 tablet 11   metFORMIN (GLUCOPHAGE XR) 500 MG 24 hr tablet Take 1 tablet (500 mg total) by mouth daily with breakfast. 90 tablet 1   metoprolol succinate (TOPROL-XL) 50 MG 24 hr tablet Take 1 tablet (50 mg total) by mouth daily. 210 tablet 0   rosuvastatin (CRESTOR) 40 MG tablet Take 1 tablet (40 mg total) by mouth daily. 90 tablet 3   sildenafil (REVATIO) 20 MG tablet TAKE 2-5 TABLETS ONCE DAILY AS NEEDED PRIOR TO SEXUAL ACTIVITY. 30 tablet 1   spironolactone (ALDACTONE) 25 MG tablet TAKE 1 TABLET (25 MG TOTAL) BY MOUTH DAILY. 90 tablet 3   triamcinolone cream (KENALOG) 0.1 % Apply 1 application topically 2 (two) times daily. Use 5 to 10 cc, with each application, from the neck to the toes.  Apply this with Lubriderm to extend the treatment to a bigger part of your body. 120 g 0   [DISCONTINUED] potassium chloride (KLOR-CON 10) 10 MEQ tablet Take 1 tablet (10 mEq total) by mouth daily. 30 tablet 1   No current facility-administered medications on file prior to visit.    ROS Comprehensive ROS pertinent positive and negative noted in HPI   Objective:  BP 120/72 (BP Location:  Right Arm, Patient Position: Sitting, Cuff Size: Normal)   Pulse 67   Temp (!) 97.3 F (36.3 C) (Temporal)   Ht 5\' 11"  (1.803 m)   Wt 191 lb (86.6 kg)   SpO2 99%   BMI 26.64 kg/m   BP Readings from Last 3 Encounters:  06/12/21 120/72  04/25/21 (!) 80/60  02/22/21 104/63    Wt Readings from Last 3 Encounters:  06/12/21 191 lb (86.6 kg)  04/25/21 187 lb (84.8 kg)  02/22/21 190 lb 9.6 oz (86.5 kg)   Physical exam: General: Vital signs reviewed.  Patient is well-developed and well-nourished, male in no acute distress and cooperative with exam. Head: Normocephalic and atraumatic. Eyes: EOMI,  conjunctivae normal, no scleral icterus. Neck: Supple, trachea midline, normal ROM, no JVD, masses, thyromegaly, or carotid bruit present. Cardiovascular: RRR, S1 normal, S2 normal, no murmurs, gallops, or rubs. Pulmonary/Chest: Clear to auscultation bilaterally, no wheezes, rales, or rhonchi. Abdominal: Soft, non-tender, non-distended, BS +, no masses, organomegaly, or guarding present. Musculoskeletal: No joint deformities, erythema, or stiffness, ROM full and nontender. Extremities: No lower extremity edema bilaterally,  pulses symmetric and intact bilaterally. No cyanosis or clubbing. Neurological: A&O x3, Strength is normal Skin: Warm, dry and intact. No rashes or erythema. Psychiatric: Normal mood and affect. speech and behavior is normal. Cognition and memory are normal.    Lab Results  Component Value Date   HGBA1C 5.7 (A) 06/12/2021   HGBA1C 6.5 (A) 02/22/2021   HGBA1C 8.3 (A) 11/23/2020    Lab Results  Component Value Date   WBC 5.0 03/28/2020   HGB 13.5 03/28/2020   HCT 41.8 03/28/2020   PLT 189 03/28/2020   GLUCOSE 116 (H) 05/03/2021   CHOL 219 (H) 02/22/2021   TRIG 175 (H) 02/22/2021   HDL 44 02/22/2021   LDLCALC 143 (H) 02/22/2021   ALT 9 02/22/2021   AST 19 02/22/2021   NA 136 05/03/2021   K 3.8 05/03/2021   CL 92 (L) 05/03/2021   CREATININE 1.60 (H) 05/03/2021   BUN 16 05/03/2021   CO2 24 05/03/2021   TSH 2.354 11/10/2009   PSA 0.46 04/02/2010   INR 1.2 03/28/2020   HGBA1C 5.7 (A) 06/12/2021   MICROALBUR 1.08 01/26/2010     Assessment & Plan:   Blayne was seen today for diabetes.  Diagnoses and all orders for this visit:  Type 2 diabetes mellitus with diabetic neuropathy, without long-term current use of insulin (HCC) -     HgB A1c 5.7 discontinued Glipizide  Diabetes is well controlled and he is monitoring carb intake -     Microalbumin/Creatinine Ratio, Urine  Need for Tdap vaccination -     Tdap vaccine greater than or equal to 7yo IM  Need  for prophylactic vaccination against Streptococcus pneumoniae (pneumococcus) -     Pneumococcal conjugate vaccine 20-valent (Prevnar 20)  Need for zoster vaccination -     Varicella-zoster vaccine IM (Shingrix)   I have discontinued French Ana A. Sherod's glipiZIDE. I am also having him maintain his acetaminophen, Lubriderm, triamcinolone cream, Clobetasol Propionate, Clobetasol Propionate, apixaban, dapagliflozin propanediol, magnesium oxide, spironolactone, sildenafil, metoprolol succinate, gabapentin, metFORMIN, rosuvastatin, and furosemide.  No orders of the defined types were placed in this encounter.    Follow-up:   Return in about 6 months (around 12/12/2021) for DM.  The above assessment and management plan was discussed with the patient. The patient verbalized understanding of and has agreed to the management plan. Patient is aware to call the clinic  if symptoms fail to improve or worsen. Patient is aware when to return to the clinic for a follow-up visit. Patient educated on when it is appropriate to go to the emergency department.   Gwinda Passe, NP-C

## 2021-06-13 LAB — MICROALBUMIN / CREATININE URINE RATIO
Creatinine, Urine: 331 mg/dL
Microalb/Creat Ratio: 13 mg/g creat (ref 0–29)
Microalbumin, Urine: 42.7 ug/mL

## 2021-07-04 ENCOUNTER — Other Ambulatory Visit: Payer: Self-pay

## 2021-07-11 ENCOUNTER — Other Ambulatory Visit: Payer: Self-pay

## 2021-07-12 ENCOUNTER — Other Ambulatory Visit: Payer: Self-pay

## 2021-07-16 ENCOUNTER — Other Ambulatory Visit: Payer: Self-pay

## 2021-07-21 MED FILL — Dapagliflozin Propanediol Tab 10 MG (Base Equivalent): ORAL | 90 days supply | Qty: 90 | Fill #4 | Status: AC

## 2021-07-23 ENCOUNTER — Other Ambulatory Visit: Payer: Self-pay

## 2021-07-24 MED FILL — Magnesium Oxide Tab 400 MG: ORAL | 30 days supply | Qty: 30 | Fill #4 | Status: AC

## 2021-07-25 ENCOUNTER — Other Ambulatory Visit: Payer: Self-pay

## 2021-07-25 ENCOUNTER — Ambulatory Visit: Payer: Self-pay | Admitting: Internal Medicine

## 2021-08-02 ENCOUNTER — Telehealth: Payer: Self-pay

## 2021-08-02 NOTE — Telephone Encounter (Signed)
Attempted to contact the patient to inform him that the [employer] - CPAP assistance program has been trying to reach him.  They have a CPAP machine available for him but they need payment prior to shipping it.   At the time the referral was placed, he said he could afford the $100 program fee. If he is unable to afford that, [employer] will assist with payment,  Message left with call back requested to this CM.

## 2021-08-02 NOTE — Telephone Encounter (Signed)
Call returned  to patient and informed him that the [employer] - CPAP assistance program has been trying to reach him.  They have a CPAP machine available for him but they need payment prior to shipping it.    He said  that he has $100 to pay the program fee; but he may wait until after the holidays to call them.  He understands that after he pays the fee, the machine will be delivered to [facility] and we will contact him to schedule CPAP teaching and pick up.  Instructed him to call this CM back if he has any questions.   The number for the [employer] was text to him as he requested.

## 2021-08-02 NOTE — Telephone Encounter (Signed)
Pt returned office call. Made pt aware of message below from RN. Pt would like to know if Erskine Squibb could give him a call back directly.

## 2021-08-07 ENCOUNTER — Other Ambulatory Visit: Payer: Self-pay

## 2021-08-08 ENCOUNTER — Other Ambulatory Visit: Payer: Self-pay

## 2021-08-10 ENCOUNTER — Other Ambulatory Visit: Payer: Self-pay

## 2021-08-22 ENCOUNTER — Other Ambulatory Visit: Payer: Self-pay | Admitting: Internal Medicine

## 2021-08-22 ENCOUNTER — Other Ambulatory Visit: Payer: Self-pay

## 2021-08-22 MED ORDER — MAGNESIUM OXIDE 400 MG PO TABS
ORAL_TABLET | ORAL | 7 refills | Status: AC
Start: 2021-08-22 — End: 2022-08-22
  Filled 2021-08-22: qty 30, 30d supply, fill #0
  Filled 2021-10-16: qty 30, 30d supply, fill #1
  Filled 2021-11-20: qty 30, 30d supply, fill #2
  Filled 2022-01-01: qty 30, 30d supply, fill #3
  Filled 2022-02-23: qty 30, 30d supply, fill #4

## 2021-08-24 ENCOUNTER — Other Ambulatory Visit: Payer: Self-pay

## 2021-09-07 ENCOUNTER — Other Ambulatory Visit: Payer: Self-pay

## 2021-09-07 ENCOUNTER — Other Ambulatory Visit: Payer: Self-pay | Admitting: *Deleted

## 2021-09-07 DIAGNOSIS — I5020 Unspecified systolic (congestive) heart failure: Secondary | ICD-10-CM

## 2021-09-08 LAB — BASIC METABOLIC PANEL
BUN/Creatinine Ratio: 11 (ref 10–24)
BUN: 12 mg/dL (ref 8–27)
CO2: 25 mmol/L (ref 20–29)
Calcium: 9.1 mg/dL (ref 8.6–10.2)
Chloride: 102 mmol/L (ref 96–106)
Creatinine, Ser: 1.14 mg/dL (ref 0.76–1.27)
Glucose: 128 mg/dL — ABNORMAL HIGH (ref 70–99)
Potassium: 4.4 mmol/L (ref 3.5–5.2)
Sodium: 141 mmol/L (ref 134–144)
eGFR: 72 mL/min/{1.73_m2} (ref >59)

## 2021-09-11 ENCOUNTER — Other Ambulatory Visit: Payer: Self-pay

## 2021-09-11 ENCOUNTER — Encounter: Payer: Self-pay | Admitting: Internal Medicine

## 2021-09-11 ENCOUNTER — Ambulatory Visit (INDEPENDENT_AMBULATORY_CARE_PROVIDER_SITE_OTHER): Payer: Self-pay | Admitting: Internal Medicine

## 2021-09-11 VITALS — BP 137/60 | HR 87 | Ht 71.0 in | Wt 186.0 lb

## 2021-09-11 DIAGNOSIS — I483 Typical atrial flutter: Secondary | ICD-10-CM

## 2021-09-11 DIAGNOSIS — I700 Atherosclerosis of aorta: Secondary | ICD-10-CM | POA: Insufficient documentation

## 2021-09-11 DIAGNOSIS — I5020 Unspecified systolic (congestive) heart failure: Secondary | ICD-10-CM

## 2021-09-11 NOTE — Patient Instructions (Signed)
Medication Instructions:  ?Your physician recommends that you continue on your current medications as directed. Please refer to the Current Medication list given to you today. ? ?*If you need a refill on your cardiac medications before your next appointment, please call your pharmacy* ? ? ?Lab Work: ?NONE ?If you have labs (blood work) drawn today and your tests are completely normal, you will receive your results only by: ?MyChart Message (if you have MyChart) OR ?A paper copy in the mail ?If you have any lab test that is abnormal or we need to change your treatment, we will call you to review the results. ? ? ?Testing/Procedures: ?NONE ? ? ?Follow-Up: ?At [facility], you and your health needs are our priority.  As part of our continuing mission to provide you with exceptional heart care, we have created designated Provider Care Teams.  These Care Teams include your primary Cardiologist (physician) and Advanced Practice Providers (APPs -  Physician Assistants and Nurse Practitioners) who all work together to provide you with the care you need, when you need it. ? ? ?Your next appointment:   ?6 month(s) ? ?The format for your next appointment:   ?In Person ? ?Provider:   ?Mahesh A Chandrasekhar, MD   ?  ?

## 2021-09-11 NOTE — Progress Notes (Signed)
Cardiology Office Note:    Date:  09/11/2021   ID:  Casey Bell, DOB Oct 13, 1955, MRN [id number]  PCP:  [name], NP  [facility] Cardiologist:  Christell Constant, MD  [facility] Electrophysiologist:  None   Referring MD: [name], NP   CC: Follow up HF  History of Present Illness:    Casey Bell is a 66 y.o. male with a hx of AFl 2:1, CHADSVASC 2, Former Alcohol Use (down from a couple beers),OSA now no CPAP, DM with HTN, Psoriasis  who presented for evaluation of atrial flutter 04/27/20.Since starting on GDMT improvement BNP has improved.  Seen In interim of this visit, patient stopped taking potassium; but has the orange card.  Notes that his psoriasis has been acting up.  In interim of this past visit, patient has had improvement in his LVEF.  We had stopped some medications related to AKI.  Patient notes that he is doing well.  Acted up a bit for the holidays, but is doing well. There are no interval hospital/ED visit.    No chest pain or pressure .  No SOB/DOE and no PND/Orthopnea.  No weight gain or leg swelling.  No palpitations or syncope .  Has had to take no extra fluid pill.    Past Medical History:  Diagnosis Date   Diabetes mellitus without complication (HCC)    Psoriasis .   Psoriasis     No past surgical history on file.  Current Medications: Current Meds  Medication Sig   acetaminophen (TYLENOL) 500 MG tablet Take 500 mg by mouth every 6 (six) hours as needed for moderate pain.   apixaban (ELIQUIS) 5 MG TABS tablet Take 1 tablet (5 mg total) by mouth 2 (two) times daily.   Clobetasol Propionate 0.05 % lotion Apply 118 mLs topically 2 (two) times daily.   Clobetasol Propionate 0.05 % shampoo Apply 0.5 mLs topically 2 (two) times daily.   dapagliflozin propanediol (FARXIGA) 10 MG TABS tablet TAKE 1 TABLET (10 MG TOTAL) BY MOUTH DAILY BEFORE BREAKFAST.   Emollient (LUBRIDERM) LOTN Apply 15 application topically in the  morning and at bedtime.   furosemide (LASIX) 20 MG tablet Take 1 tablet (20 mg total) by mouth daily.   gabapentin (NEURONTIN) 100 MG capsule Take 1 capsule (100 mg total) by mouth 3 (three) times daily.   magnesium oxide (MAG-OX) 400 MG tablet TAKE 1 TABLET (400 MG TOTAL) BY MOUTH DAILY.   metFORMIN (GLUCOPHAGE XR) 500 MG 24 hr tablet Take 1 tablet (500 mg total) by mouth daily with breakfast.   metoprolol succinate (TOPROL-XL) 50 MG 24 hr tablet Take 1 tablet (50 mg total) by mouth daily.   rosuvastatin (CRESTOR) 40 MG tablet Take 1 tablet (40 mg total) by mouth daily.   triamcinolone cream (KENALOG) 0.1 % Apply 1 application topically 2 (two) times daily. Use 5 to 10 cc, with each application, from the neck to the toes.  Apply this with Lubriderm to extend the treatment to a bigger part of your body.     Allergies:   Patient has no known allergies.   Social History   Socioeconomic History   Marital status: Divorced    Spouse name: Not on file   Number of children: Not on file   Years of education: Not on file   Highest education level: Not on file  Occupational History   Not on file  Tobacco Use   Smoking status: Former  Types: Cigarettes    Quit date: 2010    Years since quitting: 13.0   Smokeless tobacco: Never  Substance and Sexual Activity   Alcohol use: Yes    Comment: few/week   Drug use: Yes    Types: Marijuana   Sexual activity: Not Currently  Other Topics Concern   Not on file  Social History Narrative   Not on file   Social Determinants of Health   Financial Resource Strain: Not on file  Food Insecurity: Not on file  Transportation Needs: Not on file  Physical Activity: Not on file  Stress: Not on file  Social Connections: Not on file    Social: Retired in October, has had insurance issues since, Is a Nurse, 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 Sister CHF Mother  CHF  ROS:   Please see the history of present illness.     All other systems reviewed and are negative.  EKGs/Labs/Other Studies Reviewed:    The following studies were reviewed today:  EKG:   09/11/21:  SR rate 87 with PACs 04/25/2021: SR rate 94 rare PAC and PVCs 07/27/20 SR rate 77 with Occasional PACs, with LAE 04/27/20: Atrial flutter with ventricular rate 69  03/27/20: Atrial flutter with 2:1 conduction rate 135  Transthoracic Echocardiogram: Date: 12/22/20 Results:  1. Compared with the echo 03/2020, systolic function has improved.   2. Left ventricular ejection fraction, by estimation, is 40 to 45%. The  left ventricle has mildly decreased function. The left ventricle has no  regional wall motion abnormalities. Left ventricular diastolic parameters  are consistent with Grade I  diastolic dysfunction (impaired relaxation). Elevated left ventricular  end-diastolic pressure.   3. Right ventricular systolic function is normal. The right ventricular  size is normal. There is normal pulmonary artery systolic pressure.   4. The mitral valve is normal in structure. Trivial mitral valve  regurgitation. No evidence of mitral stenosis.   5. The aortic valve is tricuspid. Aortic valve regurgitation is not  visualized. No aortic stenosis is present.   6. The inferior vena cava is normal in size with greater than 50%  respiratory variability, suggesting right atrial pressure of 3 mmHg.   Recent Labs: 10/30/2020: Magnesium 1.8; NT-Pro BNP 691 02/22/2021: ALT 9 09/07/2021: BUN 12; Creatinine, Ser 1.14; Potassium 4.4; Sodium 141  Recent Lipid Panel    Component Value Date/Time   CHOL 219 (H) 02/22/2021 1056   TRIG 175 (H) 02/22/2021 1056   HDL 44 02/22/2021 1056   CHOLHDL 5.0 02/22/2021 1056   CHOLHDL 4.9 Ratio 04/02/2010 1949   VLDL 44 (H) 04/02/2010 1949   LDLCALC 143 (H) 02/22/2021 1056    Physical Exam:    VS:  BP 137/60    Pulse 87    Ht 5\' 11"  (1.803 m)    Wt 84.4 kg    SpO2  98%    BMI 25.94 kg/m     Wt Readings from Last 3 Encounters:  09/11/21 84.4 kg  06/12/21 86.6 kg  04/25/21 84.8 kg    Gen: no distress,   Neck: No JVD Cardiac: No Rubs or Gallops, no Murmur, regular +2 radial pulses Respiratory: Clear to auscultation bilaterally, normal effort, normal  respiratory rate GI: Soft, nontender, non-distended  MS: No  edema;  moves all extremities Integument: Lesions arms and legs  Neuro:  At time of evaluation, alert and oriented to person/place/time/situation  Psych: Normal affect,  patient feels well   ASSESSMENT:    1. Aortic atherosclerosis (HCC)   2. HFrEF (heart failure with reduced ejection fraction) (HCC)   3. Typical atrial flutter (HCC)      PLAN:    Heart Failure Reduced Ejection Fraction  Diabetes Mellitus with Psoriasis (coronary risk factors) - Aortic atherosclerosis - NYHA class I, Stage B, euvolemic, etiology likely AF or alcohol; no ischemic eval prior - Diuretic regimen: Lasix 40 mg PO daily (change) - Metoprolol succinate 50 mg PO Daily - with AKI on ACEi - stopped for AKI - Continue Farixga 10 mg PO daily - will check lipids and next visit and may add additional meds based on results   Paroxysmal Atrial Flutter  - Risk factors include OSA and former alcohol use - CHADSVASC= 2 . - Continue anticoagulation with Eliquis.  - Continue rate control with metoprolol succinate  Presently well compensated from his cardiac disease    Medication Adjustments/Labs and Tests Ordered: Current medicines are reviewed at length with the patient today.  Concerns regarding medicines are outlined above.  Orders Placed This Encounter  Procedures   EKG 12-Lead    No orders of the defined types were placed in this encounter.    Patient Instructions  Medication Instructions:  Your physician recommends that you continue on your current medications as directed. Please refer to the Current Medication list given to you today.  *If  you need a refill on your cardiac medications before your next appointment, please call your pharmacy*   Lab Work: NONE If you have labs (blood work) drawn today and your tests are completely normal, you will receive your results only by: MyChart Message (if you have MyChart) OR A paper copy in the mail If you have any lab test that is abnormal or we need to change your treatment, we will call you to review the results.   Testing/Procedures: NONE   Follow-Up: At [facility], you and your health needs are our priority.  As part of our continuing mission to provide you with exceptional heart care, we have created designated Provider Care Teams.  These Care Teams include your primary Cardiologist (physician) and Advanced Practice Providers (APPs -  Physician Assistants and Nurse Practitioners) who all work together to provide you with the care you need, when you need it.    Your next appointment:   6 month(s)  The format for your next appointment:   In Person  Provider:   Christell Constant, MD       Signed, Christell Constant, MD  09/11/2021 3:49 PM    [facility]

## 2021-09-13 ENCOUNTER — Telehealth: Payer: Self-pay

## 2021-09-13 NOTE — Telephone Encounter (Signed)
**Note De-Identified  Obfuscation** The pts completed BMSPAF application for Eliquis was left at the office without documents.  I have completed the provides page of his application and have e-mailed it to Dr Debby Bud nurse so she can obtain his signature, date it, and to fax to [facility] at the fax number written on the cover letter included or to place in the to be faxed basket in Medical Records to be faxed.

## 2021-09-24 ENCOUNTER — Other Ambulatory Visit: Payer: Self-pay

## 2021-09-24 DIAGNOSIS — I483 Typical atrial flutter: Secondary | ICD-10-CM

## 2021-09-24 MED ORDER — APIXABAN 5 MG PO TABS: 5.0000 mg | ORAL_TABLET | Freq: Two times a day (BID) | ORAL | 3 refills | Status: DC

## 2021-09-24 MED ORDER — APIXABAN 5 MG PO TABS: 5.0000 mg | ORAL_TABLET | Freq: Two times a day (BID) | ORAL | 3 refills | Status: AC

## 2021-09-24 NOTE — Addendum Note (Signed)
Addended by: Memory Dance on: 09/24/2021 02:37 PM   Modules accepted: Orders

## 2021-09-24 NOTE — Telephone Encounter (Signed)
Prescription refill request for Eliquis received. Indication:Aflutter Last office visit:09/11/21 (Chandrasekhar)  Scr:1.14 (09/07/21)  Age: 66 Weight: 84.4kg  Appropriate dose and refill sent to requested pharmacy.

## 2021-10-02 NOTE — Telephone Encounter (Signed)
**Note De-Identified  Obfuscation** Letter received from [facility] stating that the date on the pts consent page of his application for Eliquis is unreadable (hard to see the 3 in 2023). I have corrected and e-mailed it to Dr Debby Bud nurse so she can fax as is to [facility] at the fax number written on the cover letter included.

## 2021-10-04 NOTE — Telephone Encounter (Signed)
2 pages faxed as requested.

## 2021-10-08 ENCOUNTER — Telehealth: Payer: Self-pay

## 2021-10-08 NOTE — Telephone Encounter (Signed)
Call placed to patient and informed him that the °[employer] - CPAP Assistance Program has been trying to reach him about paying for the CPAP machine. He said he has been struggling financially and would not be able to pay at this time.  °I explained to him that [facility] has funding that will be able to pay the fee for him.  °He was very appreciative. Informed him that I will be in touch with him when the machine is delivered to this clinic. ° ° °Email sent to Alice Rolling/[employer]- CPAP Assistance Program requesting she contact this CM for payment for the patient's CPAP machine.   °

## 2021-10-08 NOTE — Telephone Encounter (Signed)
Call received from Alice Rolling/ [employer]- CPAP Assistance Program stating that they have a CPAP machine ready for delivery. This CM paid $100 program fee for patient with [facility] funds. Alice confirmed that the machine will be delivered to [facility] - [address], [address], [place], [place]  ° °

## 2021-10-09 NOTE — Telephone Encounter (Signed)
**Note De-Identified  Obfuscation** Letter received from [facility] stating that they have approved the pt for Eliquis asst until 10/26/2022. BMSPAF Case #: [id number]  The letter states that they have notified the pt of this approval as well.

## 2021-10-16 ENCOUNTER — Other Ambulatory Visit: Payer: Self-pay

## 2021-10-16 ENCOUNTER — Other Ambulatory Visit: Payer: Self-pay | Admitting: Internal Medicine

## 2021-10-17 ENCOUNTER — Telehealth: Payer: Self-pay

## 2021-10-17 ENCOUNTER — Other Ambulatory Visit: Payer: Self-pay

## 2021-10-17 MED ORDER — METOPROLOL SUCCINATE ER 50 MG PO TB24
50.0000 mg | ORAL_TABLET | Freq: Every day | ORAL | 3 refills | Status: AC
  Filled 2021-10-17 (×2): qty 90, 90d supply, fill #0
  Filled 2022-02-23: qty 90, 90d supply, fill #1

## 2021-10-17 NOTE — Telephone Encounter (Signed)
Call placed to patient and informed him that the CPAP machine has been delivered and the [facility] will be calling him to schedule a time to come in for teaching and to pick up the machine  ?

## 2021-10-19 ENCOUNTER — Other Ambulatory Visit: Payer: Self-pay

## 2021-10-26 ENCOUNTER — Ambulatory Visit (HOSPITAL_BASED_OUTPATIENT_CLINIC_OR_DEPARTMENT_OTHER): Payer: Self-pay | Attending: Internal Medicine | Admitting: Internal Medicine

## 2021-10-26 DIAGNOSIS — G4733 Obstructive sleep apnea (adult) (pediatric): Secondary | ICD-10-CM

## 2021-11-20 ENCOUNTER — Other Ambulatory Visit: Payer: Self-pay

## 2021-11-20 ENCOUNTER — Other Ambulatory Visit (INDEPENDENT_AMBULATORY_CARE_PROVIDER_SITE_OTHER): Payer: Self-pay | Admitting: Primary Care

## 2021-11-20 DIAGNOSIS — E1140 Type 2 diabetes mellitus with diabetic neuropathy, unspecified: Secondary | ICD-10-CM

## 2021-11-20 MED ORDER — METFORMIN HCL ER 500 MG PO TB24
500.0000 mg | ORAL_TABLET | Freq: Every day | ORAL | 0 refills | Status: DC
  Filled 2021-11-20: qty 30, 30d supply, fill #0

## 2021-11-20 MED ORDER — GABAPENTIN 100 MG PO CAPS
100.0000 mg | ORAL_CAPSULE | Freq: Three times a day (TID) | ORAL | 0 refills | Status: DC
  Filled 2021-11-20: qty 90, 30d supply, fill #0

## 2021-11-20 NOTE — Telephone Encounter (Signed)
Sent to PCP ?

## 2021-11-20 NOTE — Telephone Encounter (Signed)
Needs appt

## 2021-11-21 ENCOUNTER — Other Ambulatory Visit: Payer: Self-pay | Admitting: Internal Medicine

## 2021-11-21 ENCOUNTER — Other Ambulatory Visit: Payer: Self-pay

## 2021-11-21 NOTE — Telephone Encounter (Signed)
Pt's pharmacy is requesting a refill on Farxiga. This medication is no longer on pt's medication list. Does Dr. Izora Ribas want the pt to continue this medication? Please address ?

## 2021-11-27 ENCOUNTER — Other Ambulatory Visit: Payer: Self-pay

## 2021-11-27 MED ORDER — DAPAGLIFLOZIN PROPANEDIOL 10 MG PO TABS
ORAL_TABLET | Freq: Every day | ORAL | 3 refills | Status: AC
  Filled 2021-11-27: qty 90, 90d supply, fill #0

## 2021-11-27 NOTE — Telephone Encounter (Signed)
Called pt to verify still taking Farxiga 10 mg PO before breakfast. Pt reports still taking but almost out.  Advised pt that medication would be refilled.  ?

## 2021-11-28 ENCOUNTER — Other Ambulatory Visit: Payer: Self-pay

## 2021-12-17 ENCOUNTER — Other Ambulatory Visit: Payer: Self-pay

## 2021-12-17 ENCOUNTER — Encounter (INDEPENDENT_AMBULATORY_CARE_PROVIDER_SITE_OTHER): Payer: Self-pay | Admitting: Primary Care

## 2021-12-17 ENCOUNTER — Ambulatory Visit (INDEPENDENT_AMBULATORY_CARE_PROVIDER_SITE_OTHER): Payer: Self-pay | Admitting: Primary Care

## 2021-12-17 VITALS — BP 145/82 | HR 76 | Temp 98.3°F | Ht 71.0 in | Wt 190.8 lb

## 2021-12-17 DIAGNOSIS — E1140 Type 2 diabetes mellitus with diabetic neuropathy, unspecified: Secondary | ICD-10-CM

## 2021-12-17 DIAGNOSIS — L409 Psoriasis, unspecified: Secondary | ICD-10-CM

## 2021-12-17 LAB — POCT GLYCOSYLATED HEMOGLOBIN (HGB A1C): Hemoglobin A1C: 6.2 % — AB (ref 4.0–5.6)

## 2021-12-17 MED ORDER — METFORMIN HCL ER 500 MG PO TB24
500.0000 mg | ORAL_TABLET | Freq: Every day | ORAL | 1 refills | Status: AC
  Filled 2021-12-17: qty 30, 30d supply, fill #0
  Filled 2022-02-23: qty 30, 30d supply, fill #1

## 2021-12-17 MED ORDER — GABAPENTIN 100 MG PO CAPS
ORAL_CAPSULE | ORAL | 3 refills | Status: AC
Start: 2021-12-17 — End: ?
  Filled 2021-12-17: qty 120, fill #0
  Filled 2022-01-01: qty 120, 30d supply, fill #0

## 2021-12-17 NOTE — Progress Notes (Signed)
? ?Subjective:  ?Patient ID: Casey Bell, male    DOB: March 28, 1956  Age: 66 y.o. MRN: [id number] ? ?CC: Diabetes ? ? ?HPI ?[name] presents  is a 66 year old mae for follow-up of diabetes. Patient does check blood sugar at home. HTN managed by cardiology.(Checks Bp at home consistently systolic 518 and diastolic 84-16) Denies shortness of breath, headaches, chest pain or lower extremity edema  ? ?Compliant with meds - Yes ?Checking CBGs? Yes ? Fasting avg - 120-150 ? Postprandial average -  ?Exercising regularly? - Yes ?Watching carbohydrate intake? - Yes ?Neuropathy ? - Yes ?Hypoglycemic events - No ? - Recovers with :  ? ?Pertinent ROS:  ?Polyuria - No ?Polydipsia - No ?Vision problems - No ? ?Medications as noted below. Taking them regularly without complication/adverse reaction being reported today.  ? ?History ?Casey Bell has a past medical history of Diabetes mellitus without complication ([place]), Psoriasis (.), and Psoriasis.  ? ?He has no past surgical history on file.  ? ?His family history includes Chronic Renal Failure in his mother; Heart failure in his mother.He reports that he quit smoking about 13 years ago. His smoking use included cigarettes. He has never used smokeless tobacco. He reports current alcohol use. He reports current drug use. Drug: Marijuana. ? ?Current Outpatient Medications on File Prior to Visit  ?Medication Sig Dispense Refill  ? acetaminophen (TYLENOL) 500 MG tablet Take 500 mg by mouth every 6 (six) hours as needed for moderate pain.    ? apixaban (ELIQUIS) 5 MG TABS tablet Take 1 tablet (5 mg total) by mouth 2 (two) times daily. 180 tablet 3  ? dapagliflozin propanediol (FARXIGA) 10 MG TABS tablet TAKE 1 TABLET (10 MG TOTAL) BY MOUTH DAILY BEFORE BREAKFAST. 90 tablet 3  ? Emollient (LUBRIDERM) LOTN Apply 15 application topically in the morning and at bedtime. 240 mL 0  ? furosemide (LASIX) 20 MG tablet Take 1 tablet (20 mg total) by mouth daily. 90 tablet 3  ? magnesium oxide  (MAG-OX) 400 MG tablet TAKE 1 TABLET (400 MG TOTAL) BY MOUTH DAILY. 30 tablet 7  ? metoprolol succinate (TOPROL-XL) 50 MG 24 hr tablet Take 1 tablet (50 mg total) by mouth daily. 90 tablet 3  ? rosuvastatin (CRESTOR) 40 MG tablet Take 1 tablet (40 mg total) by mouth daily. 90 tablet 3  ? triamcinolone cream (KENALOG) 0.1 % Apply 1 application topically 2 (two) times daily. Use 5 to 10 cc, with each application, from the neck to the toes.  Apply this with Lubriderm to extend the treatment to a bigger part of your body. 120 g 0  ? spironolactone (ALDACTONE) 25 MG tablet TAKE 1 TABLET (25 MG TOTAL) BY MOUTH DAILY. 90 tablet 3  ? ?No current facility-administered medications on file prior to visit.  ? ? ?Comprehensive ROS Pertinent positive and negative noted in HPI   ? ?Objective:  ?BP (!) 145/82 (BP Location: Right Arm, Patient Position: Sitting, Cuff Size: Normal)   Pulse 76   Temp 98.3 ?F (36.8 ?C) (Oral)   Ht _0  (1.803 m)   Wt 190 lb 12.8 oz (86.5 kg)   SpO2 94%   BMI 26.61 kg/m?  ? ?BP Readings from Last 3 Encounters:  ?12/17/21 (!) 145/82  ?09/11/21 137/60  ?06/12/21 120/72  ? ? ?Wt Readings from Last 3 Encounters:  ?12/17/21 190 lb 12.8 oz (86.5 kg)  ?09/11/21 186 lb (84.4 kg)  ?06/12/21 191 lb (86.6 kg)  ? ? ?Physical Exam ?  Vitals reviewed.  ?Constitutional:   ?   Appearance: Normal appearance.  ?HENT:  ?   Head: Normocephalic.  ?   Right Ear: Tympanic membrane and external ear normal.  ?   Left Ear: Tympanic membrane and external ear normal.  ?   Nose: Nose normal.  ?Eyes:  ?   Extraocular Movements: Extraocular movements intact.  ?   Conjunctiva/sclera: Conjunctivae normal.  ?   Pupils: Pupils are equal, round, and reactive to light.  ?Cardiovascular:  ?   Rate and Rhythm: Normal rate and regular rhythm.  ?Pulmonary:  ?   Effort: Pulmonary effort is normal.  ?   Breath sounds: Normal breath sounds.  ?Abdominal:  ?   General: Bowel sounds are normal. There is distension.  ?   Palpations: Abdomen is  soft.  ?Musculoskeletal:     ?   General: Normal range of motion.  ?   Cervical back: Normal range of motion and neck supple.  ?Skin: ?   General: Skin is dry.  ?   Findings: Erythema and rash present.  ?   Comments: Psoriasis   ?Neurological:  ?   Mental Status: He is alert and oriented to person, place, and time.  ?Psychiatric:     ?   Mood and Affect: Mood normal.     ?   Behavior: Behavior normal.     ?   Thought Content: Thought content normal.     ?   Judgment: Judgment normal.  ? ?Lab Results  ?Component Value Date  ? HGBA1C 6.2 (A) 12/17/2021  ? HGBA1C 5.7 (A) 06/12/2021  ? HGBA1C 6.5 (A) 02/22/2021  ? ? ?Lab Results  ?Component Value Date  ? WBC 5.0 03/28/2020  ? HGB 13.5 03/28/2020  ? HCT 41.8 03/28/2020  ? PLT 189 03/28/2020  ? GLUCOSE 128 (H) 09/07/2021  ? CHOL 219 (H) 02/22/2021  ? TRIG 175 (H) 02/22/2021  ? HDL 44 02/22/2021  ? LDLCALC 143 (H) 02/22/2021  ? ALT 9 02/22/2021  ? AST 19 02/22/2021  ? NA 141 09/07/2021  ? K 4.4 09/07/2021  ? CL 102 09/07/2021  ? CREATININE 1.14 09/07/2021  ? BUN 12 09/07/2021  ? CO2 25 09/07/2021  ? TSH 2.354 11/10/2009  ? PSA 0.46 04/02/2010  ? INR 1.2 03/28/2020  ? HGBA1C 6.2 (A) 12/17/2021  ? MICROALBUR 1.08 01/26/2010  ? ? ? ?Assessment & Plan:  ? ?Casey Bell was seen today for diabetes. ? ?Diagnoses and all orders for this visit: ? ?Type 2 diabetes mellitus with diabetic neuropathy, without long-term current use of insulin ([place]) ?-     HgB A1c 6.2 He is at ADA goal 6.5 hemoglobin A1c.   ?Continue to monitor foods that are high in carbohydrates are the following rice, potatoes, breads, sugars, and pastas.  Reduction in the intake (eating) will assist in lowering your blood sugars.  ?Increased metformin XR bid ?-     Lipid Panel ?-     CMP14+EGFR ?-     CBC with Differential ? ?Psoriasis ?Flare ups unable to afford dermatologist using Lubriderm. Presently trying to get disability encourage to apply for medicaid.  ?  ? ?I have discontinued [name]'s Clobetasol  Propionate and Clobetasol Propionate. I have also changed his gabapentin. Additionally, I am having him maintain his acetaminophen, Lubriderm, triamcinolone cream, spironolactone, rosuvastatin, furosemide, magnesium oxide, apixaban, metoprolol succinate, dapagliflozin propanediol, and metFORMIN. ? ?Meds ordered this encounter  ?Medications  ? metFORMIN (GLUCOPHAGE-XR) 500 MG 24 hr tablet  ?  Sig: Take 1 tablet (500 mg total) by mouth daily with breakfast.  ?  Dispense:  180 tablet  ?  Refill:  1  ? gabapentin (NEURONTIN) 100 MG capsule  ?  Sig: Take 1 tablet after breakfast and lunch . Bedtime take 2 tablets.  ?  Dispense:  120 capsule  ?  Refill:  3  ? ? ? ?Follow-up:  ? ?No follow-ups on file. ? ?The above assessment and management plan was discussed with the patient. The patient verbalized understanding of and has agreed to the management plan. Patient is aware to call the clinic if symptoms fail to improve or worsen. Patient is aware when to return to the clinic for a follow-up visit. Patient educated on when it is appropriate to go to the emergency department.  ? ?Juluis Mire, NP-C ? ?  ?

## 2021-12-17 NOTE — Patient Instructions (Signed)

## 2021-12-19 LAB — CBC WITH DIFFERENTIAL/PLATELET
Basophils Absolute: 0 10*3/uL (ref 0.0–0.2)
Basos: 1 %
EOS (ABSOLUTE): 0.1 10*3/uL (ref 0.0–0.4)
Eos: 1 %
Hematocrit: 41.5 % (ref 37.5–51.0)
Hemoglobin: 12.9 g/dL — ABNORMAL LOW (ref 13.0–17.7)
Immature Grans (Abs): 0 10*3/uL (ref 0.0–0.1)
Immature Granulocytes: 0 %
Lymphocytes Absolute: 1.8 10*3/uL (ref 0.7–3.1)
Lymphs: 32 %
MCH: 28.6 pg (ref 26.6–33.0)
MCHC: 31.1 g/dL — ABNORMAL LOW (ref 31.5–35.7)
MCV: 92 fL (ref 79–97)
Monocytes Absolute: 0.5 10*3/uL (ref 0.1–0.9)
Monocytes: 8 %
Neutrophils Absolute: 3.4 10*3/uL (ref 1.4–7.0)
Neutrophils: 58 %
Platelets: 173 10*3/uL (ref 150–450)
RBC: 4.51 x10E6/uL (ref 4.14–5.80)
RDW: 14.1 % (ref 11.6–15.4)
WBC: 5.8 10*3/uL (ref 3.4–10.8)

## 2021-12-19 LAB — CMP14+EGFR
ALT: 15 IU/L (ref 0–44)
AST: 30 IU/L (ref 0–40)
Albumin/Globulin Ratio: 1.3 (ref 1.2–2.2)
Albumin: 4.7 g/dL (ref 3.8–4.8)
Alkaline Phosphatase: 65 IU/L (ref 44–121)
BUN/Creatinine Ratio: 11 (ref 10–24)
BUN: 13 mg/dL (ref 8–27)
Bilirubin Total: 0.4 mg/dL (ref 0.0–1.2)
CO2: 23 mmol/L (ref 20–29)
Calcium: 9.7 mg/dL (ref 8.6–10.2)
Chloride: 100 mmol/L (ref 96–106)
Creatinine, Ser: 1.16 mg/dL (ref 0.76–1.27)
Globulin, Total: 3.5 g/dL (ref 1.5–4.5)
Glucose: 85 mg/dL (ref 70–99)
Potassium: 4.3 mmol/L (ref 3.5–5.2)
Sodium: 142 mmol/L (ref 134–144)
Total Protein: 8.2 g/dL (ref 6.0–8.5)
eGFR: 71 mL/min/{1.73_m2} (ref >59)

## 2021-12-19 LAB — LIPID PANEL
Chol/HDL Ratio: 2.5 ratio (ref 0.0–5.0)
Cholesterol, Total: 195 mg/dL (ref 100–199)
HDL: 77 mg/dL (ref >39)
LDL Chol Calc (NIH): 90 mg/dL (ref 0–99)
Triglycerides: 164 mg/dL — ABNORMAL HIGH (ref 0–149)
VLDL Cholesterol Cal: 28 mg/dL (ref 5–40)

## 2021-12-20 ENCOUNTER — Other Ambulatory Visit: Payer: Self-pay

## 2021-12-20 ENCOUNTER — Other Ambulatory Visit (INDEPENDENT_AMBULATORY_CARE_PROVIDER_SITE_OTHER): Payer: Self-pay | Admitting: Primary Care

## 2021-12-20 MED ORDER — ROSUVASTATIN CALCIUM 10 MG PO TABS
40.0000 mg | ORAL_TABLET | Freq: Every day | ORAL | 1 refills | Status: AC
  Filled 2021-12-20: qty 90, 22d supply, fill #0
  Filled 2022-02-23: qty 90, 22d supply, fill #1

## 2022-01-01 ENCOUNTER — Other Ambulatory Visit: Payer: Self-pay

## 2022-01-02 ENCOUNTER — Other Ambulatory Visit: Payer: Self-pay

## 2022-02-11 ENCOUNTER — Other Ambulatory Visit: Payer: Self-pay

## 2022-02-25 ENCOUNTER — Other Ambulatory Visit: Payer: Self-pay

## 2022-03-15 ENCOUNTER — Encounter: Payer: Self-pay | Admitting: Internal Medicine

## 2022-03-15 ENCOUNTER — Ambulatory Visit (INDEPENDENT_AMBULATORY_CARE_PROVIDER_SITE_OTHER): Payer: Self-pay | Admitting: Internal Medicine

## 2022-03-15 ENCOUNTER — Other Ambulatory Visit: Payer: Self-pay

## 2022-03-15 VITALS — BP 107/70 | HR 59 | Ht 71.0 in | Wt 188.0 lb

## 2022-03-15 DIAGNOSIS — I5020 Unspecified systolic (congestive) heart failure: Secondary | ICD-10-CM

## 2022-03-15 DIAGNOSIS — E119 Type 2 diabetes mellitus without complications: Secondary | ICD-10-CM

## 2022-03-15 DIAGNOSIS — I483 Typical atrial flutter: Secondary | ICD-10-CM

## 2022-03-15 DIAGNOSIS — I700 Atherosclerosis of aorta: Secondary | ICD-10-CM

## 2022-03-15 MED ORDER — METOPROLOL TARTRATE 25 MG PO TABS
ORAL_TABLET | ORAL | 0 refills | Status: AC
  Filled 2022-03-15: qty 1, 1d supply, fill #0

## 2022-03-15 MED ORDER — EZETIMIBE 10 MG PO TABS
10.0000 mg | ORAL_TABLET | Freq: Every day | ORAL | 3 refills | Status: AC
  Filled 2022-03-15: qty 30, 30d supply, fill #0

## 2022-03-15 NOTE — Progress Notes (Signed)
Cardiology Office Note:    Date:  03/15/2022   ID:  Casey MURGIA, DOB October 13, 1955, MRN [id number]  PCP:  [name], NP  [facility] Cardiologist:  Christell Constant, MD  [facility] Electrophysiologist:  None   Referring MD: [name], NP   CC: Follow up HF  History of Present Illness:    Casey Bell is a 66 y.o. male with a hx of AFl 2:1, CHADSVASC 2, Former Alcohol Use (down from a couple beers),OSA now on CPAP, DM with HTN, Psoriasis  who presented for evaluation of atrial flutter 04/27/20. 2021: improved EF on GDMT 2022: increased diuretics, eliquis assist 2023: decreased diuretics again  Patient notes that he is doing well.   Since last visit notes that is if something is wrong with him, he doesn't know feel it. There are no interval hospital/ED visit.    No chest pain or pressure .  No SOB/DOE and no PND/Orthopnea.  No weight gain or leg swelling.  No palpitations or syncope.  Has enjoyed retirement.  No fatigue.  No issues on the lower dose of diuretic   Past Medical History:  Diagnosis Date   Diabetes mellitus without complication (HCC)    Psoriasis .   Psoriasis     No past surgical history on file.  Current Medications: Current Meds  Medication Sig   acetaminophen (TYLENOL) 500 MG tablet Take 500 mg by mouth every 6 (six) hours as needed for moderate pain.   apixaban (ELIQUIS) 5 MG TABS tablet Take 1 tablet (5 mg total) by mouth 2 (two) times daily.   dapagliflozin propanediol (FARXIGA) 10 MG TABS tablet TAKE 1 TABLET (10 MG TOTAL) BY MOUTH DAILY BEFORE BREAKFAST.   Emollient (LUBRIDERM) LOTN Apply 15 application topically in the morning and at bedtime.   ezetimibe (ZETIA) 10 MG tablet Take 1 tablet (10 mg total) by mouth daily.   furosemide (LASIX) 20 MG tablet Take 1 tablet (20 mg total) by mouth daily.   gabapentin (NEURONTIN) 100 MG capsule Take 1 capsule by mouth after breakfast and lunch and at then 2 capsules at bedtime    magnesium oxide (MAG-OX) 400 MG tablet TAKE 1 TABLET (400 MG TOTAL) BY MOUTH DAILY.   metFORMIN (GLUCOPHAGE-XR) 500 MG 24 hr tablet Take 1 tablet (500 mg total) by mouth daily with breakfast.   metoprolol succinate (TOPROL-XL) 50 MG 24 hr tablet Take 1 tablet (50 mg total) by mouth daily.   metoprolol tartrate (LOPRESSOR) 25 MG tablet Take 2 hours prior to Cardiac CT   rosuvastatin (CRESTOR) 10 MG tablet Take 4 tablets (40 mg total) by mouth daily.   spironolactone (ALDACTONE) 25 MG tablet TAKE 1 TABLET (25 MG TOTAL) BY MOUTH DAILY.   triamcinolone cream (KENALOG) 0.1 % Apply 1 application topically 2 (two) times daily. Use 5 to 10 cc, with each application, from the neck to the toes.  Apply this with Lubriderm to extend the treatment to a bigger part of your body.     Allergies:   Patient has no known allergies.   Social History   Socioeconomic History   Marital status: Divorced    Spouse name: Not on file   Number of children: Not on file   Years of education: Not on file   Highest education level: Not on file  Occupational History   Not on file  Tobacco Use   Smoking status: Former    Types: Cigarettes    Quit date: 2010  Years since quitting: 13.5   Smokeless tobacco: Never  Substance and Sexual Activity   Alcohol use: Yes    Comment: few/week   Drug use: Yes    Types: Marijuana   Sexual activity: Not Currently  Other Topics Concern   Not on file  Social History Narrative   Not on file   Social Determinants of Health   Financial Resource Strain: Not on file  Food Insecurity: Not on file  Transportation Needs: Not on file  Physical Activity: Not on file  Stress: Not on file  Social Connections: Not on file    Social: Retired in October, has had insurance issues since, Is a Nurse, learning disability  Family History: The patient's family history includes Chronic Renal Failure in his mother; Heart failure in his mother. Sister CHF Mother CHF  ROS:   Please see the history  of present illness.     All other systems reviewed and are negative.  EKGs/Labs/Other Studies Reviewed:    The following studies were reviewed today:  EKG:   03/15/22: sinus bradycardia  09/11/21:  SR rate 87 with PACs 04/25/2021: SR rate 94 rare PAC and PVCs 07/27/20 SR rate 77 with Occasional PACs, with LAE 04/27/20: Atrial flutter with ventricular rate 69  03/27/20: Atrial flutter with 2:1 conduction rate 135  Transthoracic Echocardiogram: Date: 12/22/20 Results:  1. Compared with the echo 03/2020, systolic function has improved.   2. Left ventricular ejection fraction, by estimation, is 40 to 45%. The  left ventricle has mildly decreased function. The left ventricle has no  regional wall motion abnormalities. Left ventricular diastolic parameters  are consistent with Grade I  diastolic dysfunction (impaired relaxation). Elevated left ventricular  end-diastolic pressure.   3. Right ventricular systolic function is normal. The right ventricular  size is normal. There is normal pulmonary artery systolic pressure.   4. The mitral valve is normal in structure. Trivial mitral valve  regurgitation. No evidence of mitral stenosis.   5. The aortic valve is tricuspid. Aortic valve regurgitation is not  visualized. No aortic stenosis is present.   6. The inferior vena cava is normal in size with greater than 50%  respiratory variability, suggesting right atrial pressure of 3 mmHg.   Recent Labs: 12/17/2021: ALT 15; BUN 13; Creatinine, Ser 1.16; Hemoglobin 12.9; Platelets 173; Potassium 4.3; Sodium 142  Recent Lipid Panel    Component Value Date/Time   CHOL 195 12/17/2021 1553   TRIG 164 (H) 12/17/2021 1553   HDL 77 12/17/2021 1553   CHOLHDL 2.5 12/17/2021 1553   CHOLHDL 4.9 Ratio 04/02/2010 1949   VLDL 44 (H) 04/02/2010 1949   LDLCALC 90 12/17/2021 1553    Physical Exam:    VS:  BP 107/70   Pulse (!) 59   Ht 5\' 11"  (1.803 m)   Wt 188 lb (85.3 kg)   SpO2 98%   BMI 26.22 kg/m      Wt Readings from Last 3 Encounters:  03/15/22 188 lb (85.3 kg)  12/17/21 190 lb 12.8 oz (86.5 kg)  09/11/21 186 lb (84.4 kg)    Gen: no distress   Neck: No JVD Cardiac: No Rubs or Gallops, no murmur, regular bradycardia +2 radial pulses Respiratory: Clear to auscultation bilaterally, normal effort, normal  respiratory rate GI: Soft, nontender, non-distended  MS: No  edema;  moves all extremities Integument: Skin feels well, psoriatic lesions on arms Neuro:  At time of evaluation, alert and oriented to person/place/time/situation  Psych: Normal affect, patient feels well  ASSESSMENT:    1. Aortic atherosclerosis (HCC)   2. HFrEF (heart failure with reduced ejection fraction) (HCC)   3. Diabetes mellitus without complication (HCC)   4. Typical atrial flutter (HCC)     PLAN:    Heart Failure Reduced Ejection Fraction  Diabetes Mellitus with Psoriasis (coronary risk factors) Aortic atherosclerosis - NYHA class I, Stage B, euvolemic, etiology likely AF or alcohol; no ischemic eval prior - Diuretic regimen: Lasix 20 mg PO daily  - Metoprolol succinate 50 mg PO Daily - with AKI on ACEi ( stopped for AK); low BP preclude ARNI -EF above 40%, MRA deferred presently - Continue Farixga 10 mg PO daily -LDL 90 12/2021  Paroxysmal Atrial Flutter  - Risk factors include OSA and former alcohol use - CHADSVASC= 2 . - Continue anticoagulation with Eliquis.  - Continue rate control with metoprolol succinate  Six months with APP One year with me    Medication Adjustments/Labs and Tests Ordered: Current medicines are reviewed at length with the patient today.  Concerns regarding medicines are outlined above.  Orders Placed This Encounter  Procedures   CT CORONARY MORPH W/CTA COR W/SCORE W/CA W/CM &/OR WO/CM   Basic metabolic panel   ALT   Lipid panel   EKG 12-Lead    Meds ordered this encounter  Medications   ezetimibe (ZETIA) 10 MG tablet    Sig: Take 1 tablet (10 mg total)  by mouth daily.    Dispense:  90 tablet    Refill:  3   metoprolol tartrate (LOPRESSOR) 25 MG tablet    Sig: Take 2 hours prior to Cardiac CT    Dispense:  1 tablet    Refill:  0     Patient Instructions  Medication Instructions:  Your physician has recommended you make the following change in your medication:  START: ezetimibe (Zetia) 10 mg by mouth once daily  *If you need a refill on your cardiac medications before your next appointment, please call your pharmacy*   Lab Work: TODAY: BMP 3 MONTHS: Fasting lipid panel and ALT (nothing to eat or drink except water or black coffee 8-12 hours prior)  If you have labs (blood work) drawn today and your tests are completely normal, you will receive your results only by: MyChart Message (if you have MyChart) OR A paper copy in the mail If you have any lab test that is abnormal or we need to change your treatment, we will call you to review the results.   Testing/Procedures: Your physician has requested that you have cardiac CT. Cardiac computed tomography (CT) is a painless test that uses an x-ray machine to take clear, detailed pictures of your heart. For further information please visit [email or web address] Please follow instruction sheet as given.     Follow-Up: At [facility], you and your health needs are our priority.  As part of our continuing mission to provide you with exceptional heart care, we have created designated Provider Care Teams.  These Care Teams include your primary Cardiologist (physician) and Advanced Practice Providers (APPs -  Physician Assistants and Nurse Practitioners) who all work together to provide you with the care you need, when you need it.  We recommend signing up for the patient portal called "MyChart".  Sign up information is provided on this After Visit Summary.  MyChart is used to connect with patients for Virtual Visits (Telemedicine).  Patients are able to view lab/test results, encounter  notes, upcoming appointments, etc.  Non-urgent messages can be  sent to your provider as well.   To learn more about what you can do with MyChart, go to [email or web address].    Your next appointment:   6 month(s)  The format for your next appointment:   In Person  Provider:   Ronie Spies, PA-C, Jacolyn Reedy, PA-C, or Eligha Bridegroom, NP     Then, Christell Constant, MD will plan to see you again in 1 year(s).    Other Instructions   Your cardiac CT will be scheduled at one of the below locations:   [facility] [address] [place], [place] [address] [phone]  OR  [facility] [address] [place], [place] [address] [phone]  If scheduled at [facility], please arrive at the [facility] and Children's Entrance (Entrance C2) of [facility] 30 minutes prior to test start time. You can use the FREE valet parking offered at entrance C (encouraged to control the heart rate for the test)  Proceed to the [facility] Radiology Department (first floor) to check-in and test prep.  All radiology patients and guests should use entrance C2 at [facility], accessed from [facility], even though the hospital's physical address listed is [address].    If scheduled at [facility], please arrive 15 mins early for check-in and test prep.  Please follow these instructions carefully (unless otherwise directed):  Hold all erectile dysfunction medications at least 3 days (72 hrs) prior to test.  On the Night Before the Test: Be sure to Drink plenty of water. Do not consume any caffeinated/decaffeinated beverages or chocolate 12 hours prior to your test. Do not take any antihistamines 12 hours prior to your test.  On the Day of the Test: Drink plenty of water until 1 hour prior to the test. Do not eat any food 4 hours prior to the test. You may  take your regular medications prior to the test.  Take metoprolol (Lopressor) 25 mg by mouth two hours prior to test. HOLD Furosemide morning of the test       After the Test: Drink plenty of water. After receiving IV contrast, you may experience a mild flushed feeling. This is normal. On occasion, you may experience a mild rash up to 24 hours after the test. This is not dangerous. If this occurs, you can take Benadryl 25 mg and increase your fluid intake. If you experience trouble breathing, this can be serious. If it is severe call 911 IMMEDIATELY. If it is mild, please call our office. If you take any of these medications: Glipizide/Metformin, Avandament, Glucavance, please do not take 48 hours after completing test unless otherwise instructed.  We will call to schedule your test 2-4 weeks out understanding that some insurance companies will need an authorization prior to the service being performed.   For non-scheduling related questions, please contact the cardiac imaging nurse navigator should you have any questions/concerns: Rockwell Alexandria, Cardiac Imaging Nurse Navigator Larey Brick, Cardiac Imaging Nurse Navigator [facility] Direct Office Dial: [phone]   For scheduling needs, including cancellations and rescheduling, please call [place], [phone].   Important Information About Sugar         Signed, Christell Constant, MD  03/15/2022 12:48 PM    [facility]

## 2022-03-15 NOTE — Patient Instructions (Signed)
Medication Instructions:  Your physician has recommended you make the following change in your medication:  START: ezetimibe (Zetia) 10 mg by mouth once daily  *If you need a refill on your cardiac medications before your next appointment, please call your pharmacy*   Lab Work: TODAY: BMP 3 MONTHS: Fasting lipid panel and ALT (nothing to eat or drink except water or black coffee 8-12 hours prior)  If you have labs (blood work) drawn today and your tests are completely normal, you will receive your results only by: MyChart Message (if you have MyChart) OR A paper copy in the mail If you have any lab test that is abnormal or we need to change your treatment, we will call you to review the results.   Testing/Procedures: Your physician has requested that you have cardiac CT. Cardiac computed tomography (CT) is a painless test that uses an x-ray machine to take clear, detailed pictures of your heart. For further information please visit [email or web address] Please follow instruction sheet as given.     Follow-Up: At [facility], you and your health needs are our priority.  As part of our continuing mission to provide you with exceptional heart care, we have created designated Provider Care Teams.  These Care Teams include your primary Cardiologist (physician) and Advanced Practice Providers (APPs -  Physician Assistants and Nurse Practitioners) who all work together to provide you with the care you need, when you need it.  We recommend signing up for the patient portal called "MyChart".  Sign up information is provided on this After Visit Summary.  MyChart is used to connect with patients for Virtual Visits (Telemedicine).  Patients are able to view lab/test results, encounter notes, upcoming appointments, etc.  Non-urgent messages can be sent to your provider as well.   To learn more about what you can do with MyChart, go to [email or web address].    Your next appointment:   6  month(s)  The format for your next appointment:   In Person  Provider:   Ronie Spies, PA-C, Jacolyn Reedy, PA-C, or Eligha Bridegroom, NP     Then, Christell Constant, MD will plan to see you again in 1 year(s).    Other Instructions   Your cardiac CT will be scheduled at one of the below locations:   [facility] [address] [place], [place] [address] [phone]  OR  [facility] [address] [place], [place] [address] [phone]  If scheduled at [facility], please arrive at the [facility] and Children's Entrance (Entrance C2) of [facility] 30 minutes prior to test start time. You can use the FREE valet parking offered at entrance C (encouraged to control the heart rate for the test)  Proceed to the [facility] Radiology Department (first floor) to check-in and test prep.  All radiology patients and guests should use entrance C2 at [facility], accessed from [facility], even though the hospital's physical address listed is [address]    If scheduled at [facility], please arrive 15 mins early for check-in and test prep.  Please follow these instructions carefully (unless otherwise directed):  Hold all erectile dysfunction medications at least 3 days (72 hrs) prior to test.  On the Night Before the Test: Be sure to Drink plenty of water. Do not consume any caffeinated/decaffeinated beverages or chocolate 12 hours prior to your test. Do not take any antihistamines 12 hours prior to  your test.  On the Day of the Test: Drink plenty of water until 1 hour prior to the test. Do not eat any food 4 hours prior to the test. You may take your regular medications prior to the test.  Take metoprolol (Lopressor) 25 mg by mouth two hours prior to test. HOLD Furosemide morning of the test       After the Test: Drink plenty of  water. After receiving IV contrast, you may experience a mild flushed feeling. This is normal. On occasion, you may experience a mild rash up to 24 hours after the test. This is not dangerous. If this occurs, you can take Benadryl 25 mg and increase your fluid intake. If you experience trouble breathing, this can be serious. If it is severe call 911 IMMEDIATELY. If it is mild, please call our office. If you take any of these medications: Glipizide/Metformin, Avandament, Glucavance, please do not take 48 hours after completing test unless otherwise instructed.  We will call to schedule your test 2-4 weeks out understanding that some insurance companies will need an authorization prior to the service being performed.   For non-scheduling related questions, please contact the cardiac imaging nurse navigator should you have any questions/concerns: Rockwell Alexandria, Cardiac Imaging Nurse Navigator Larey Brick, Cardiac Imaging Nurse Navigator  [facility] Direct Office Dial: [phone]   For scheduling needs, including cancellations and rescheduling, please call [place], [phone].   Important Information About Sugar

## 2022-03-16 LAB — BASIC METABOLIC PANEL
BUN/Creatinine Ratio: 11 (ref 10–24)
BUN: 14 mg/dL (ref 8–27)
CO2: 23 mmol/L (ref 20–29)
Calcium: 9.2 mg/dL (ref 8.6–10.2)
Chloride: 103 mmol/L (ref 96–106)
Creatinine, Ser: 1.28 mg/dL — ABNORMAL HIGH (ref 0.76–1.27)
Glucose: 131 mg/dL — ABNORMAL HIGH (ref 70–99)
Potassium: 4.2 mmol/L (ref 3.5–5.2)
Sodium: 141 mmol/L (ref 134–144)
eGFR: 63 mL/min/{1.73_m2} (ref >59)

## 2022-03-19 ENCOUNTER — Ambulatory Visit (INDEPENDENT_AMBULATORY_CARE_PROVIDER_SITE_OTHER): Payer: Self-pay | Admitting: Primary Care

## 2022-03-21 ENCOUNTER — Ambulatory Visit (INDEPENDENT_AMBULATORY_CARE_PROVIDER_SITE_OTHER): Payer: Self-pay | Admitting: Primary Care

## 2022-03-21 ENCOUNTER — Telehealth: Payer: Self-pay | Admitting: Licensed Clinical Social Worker

## 2022-03-21 ENCOUNTER — Other Ambulatory Visit: Payer: Self-pay

## 2022-03-21 NOTE — Telephone Encounter (Signed)
H&V Care Navigation CSW Progress Note  Clinical Social Worker contacted patient by phone to complete SDOH Screening/provide further assistance. Pt did not answer at [phone], left voicemail requesting call back. Will re-attempt as able.  Patient is participating in a Managed Medicaid Plan:  No, self pay only.   SDOH Screenings   Alcohol Screen: Not on file  Depression (PHQ2-9): Low Risk  (12/17/2021)   Depression (PHQ2-9)    PHQ-2 Score: 1  Financial Resource Strain: Not on file  Food Insecurity: Not on file  Housing: Not on file  Physical Activity: Not on file  Social Connections: Not on file  Stress: Not on file  Tobacco Use: Medium Risk (03/15/2022)   Patient History    Smoking Tobacco Use: Former    Smokeless Tobacco Use: Never    Passive Exposure: Not on file  Transportation Needs: Not on file    Octavio Graves, MSW, LCSW Clinical Social Worker II [facility] Heart/Vascular Care Navigation  [phone]- work cell phone (preferred) [phone]- desk phone

## 2022-04-09 ENCOUNTER — Other Ambulatory Visit (HOSPITAL_COMMUNITY): Payer: Self-pay

## 2022-04-12 ENCOUNTER — Telehealth: Payer: Self-pay | Admitting: Internal Medicine

## 2022-04-12 NOTE — Telephone Encounter (Signed)
Called to give our condolence of his loss to his sister.  We talked about how much he loved the 9ers; the did roses in [employer]ers colors at the funeral.  Memory Argue our continued thoughts and prayers.  Riley Lam, MD FASE Cardiologist [facility]  [address] [place], [address] [place], [place] [address] [phone]  1:12 PM

## 2022-05-10 IMAGING — DX DG CHEST 1V PORT
1 series · 1 of 1 positions shown · non-contrast
Comparison: None.

CLINICAL DATA: Cardiac palpitations

EXAM:
PORTABLE CHEST 1 VIEW

[chest ap]
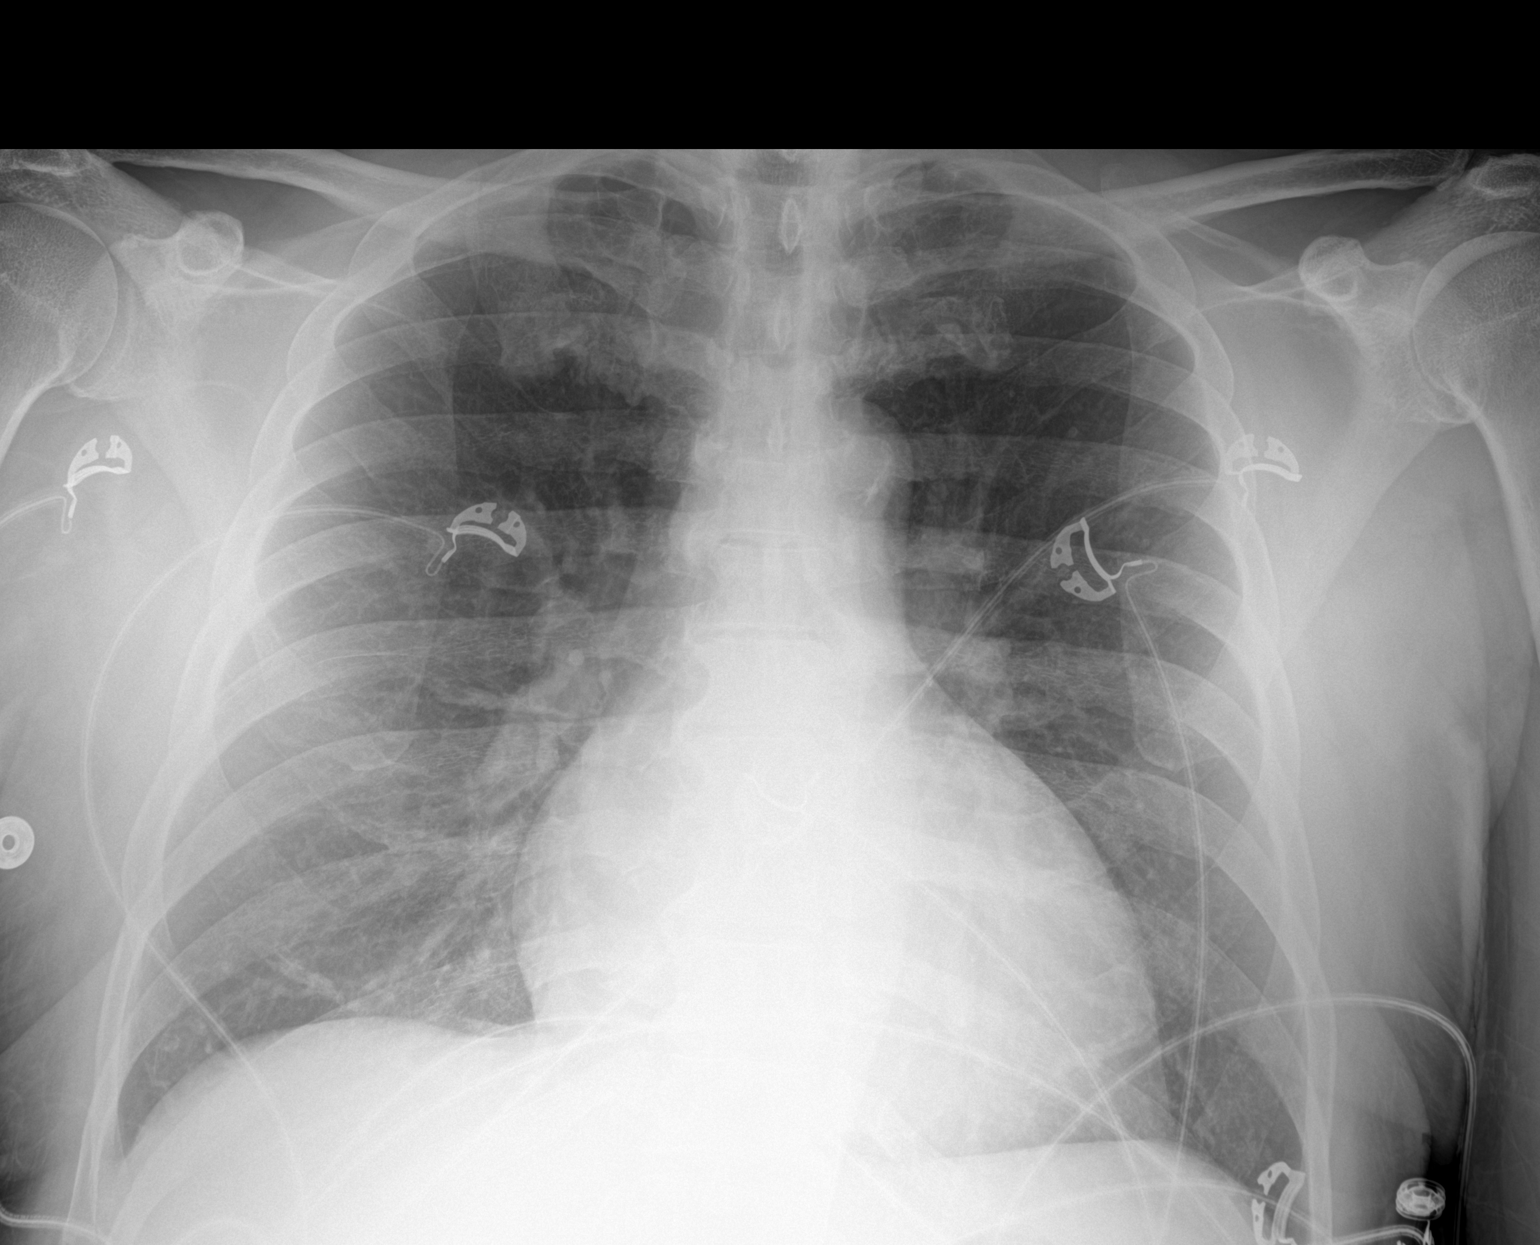

[1 of 1 positions shown; findings below may reference images not displayed]

FINDINGS: Lungs are clear. Heart is borderline enlarged with pulmonary
vascularity normal. No adenopathy. There is aortic atherosclerosis.
No bone lesions.
IMPRESSION: Lungs clear. Heart borderline enlarged. No adenopathy. Aortic
Atherosclerosis ([id number]).

## 2022-06-28 ENCOUNTER — Other Ambulatory Visit: Payer: Self-pay

## 2022-07-09 IMAGING — US US ABDOMEN COMPLETE
1 series · 13 of 25 positions shown · non-contrast
Comparison: None.

CLINICAL DATA: Elevated liver enzymes

EXAM:
ABDOMEN ULTRASOUND COMPLETE

[Series 1: us abdomen complete · 13 of 112 slices shown]
[im 1/112]
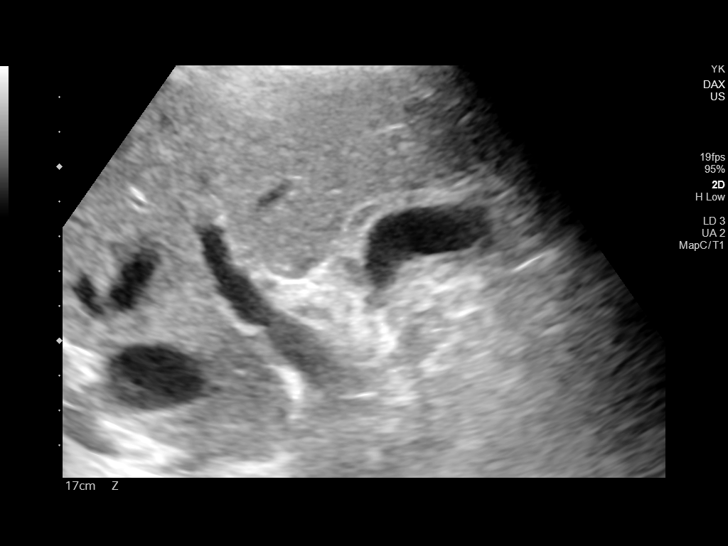
[im 10/112]
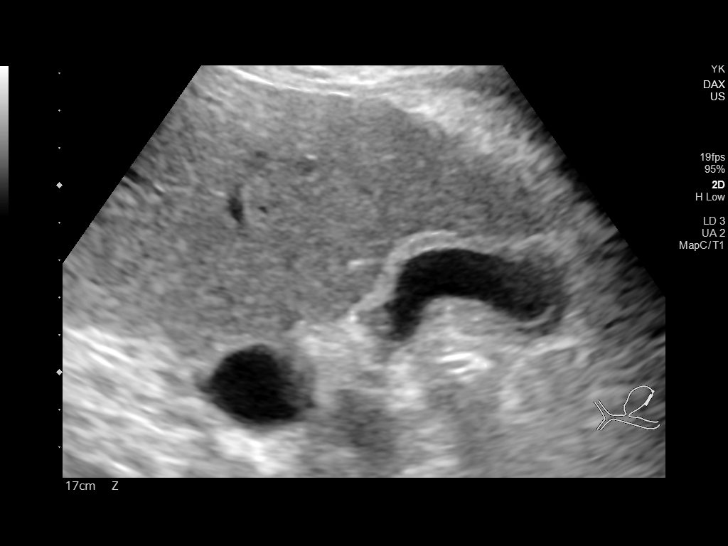
[im 19/112]
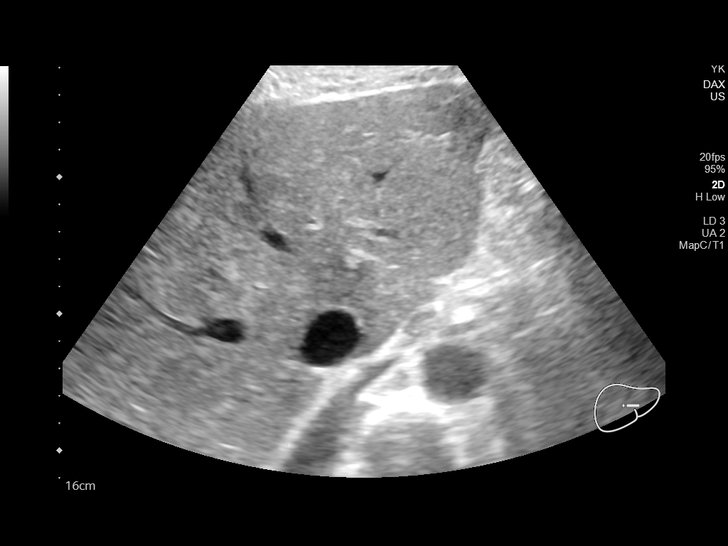
[im 28/112]
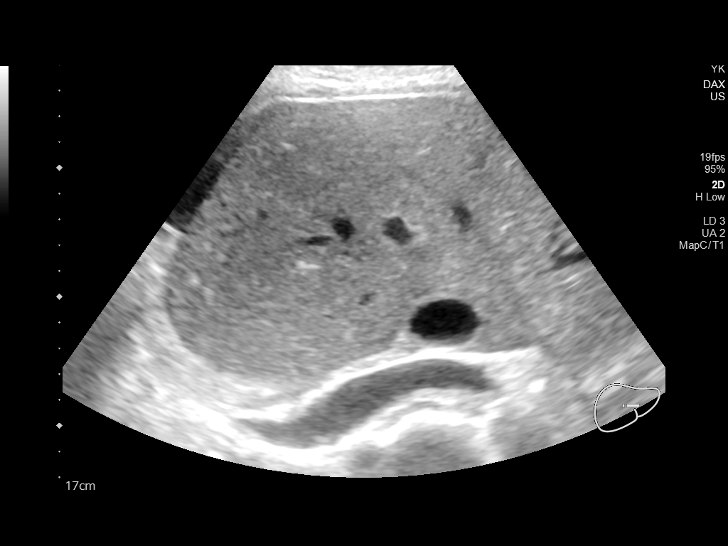
[im 38/112]
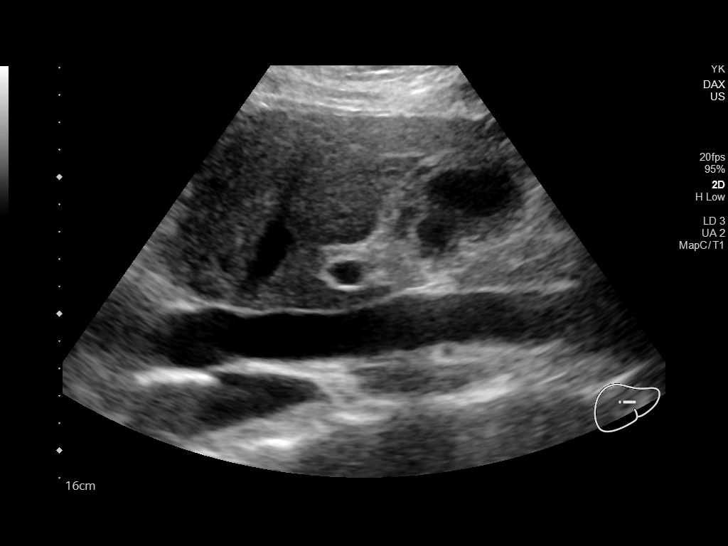
[im 47/112]
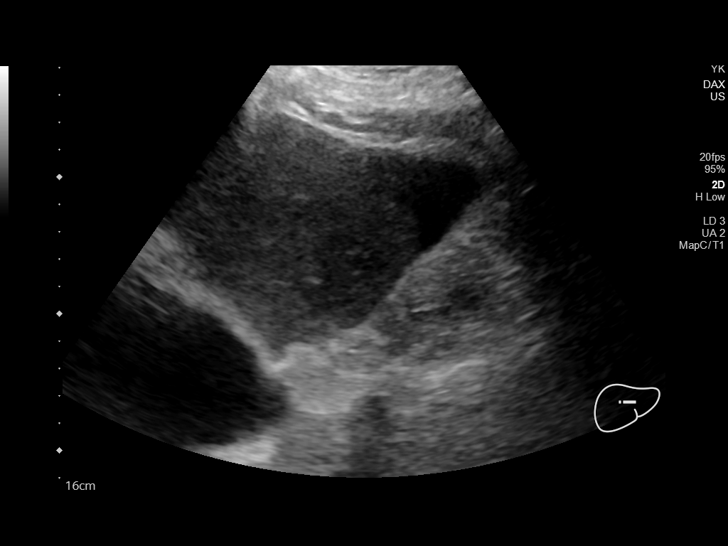
[im 56/112]
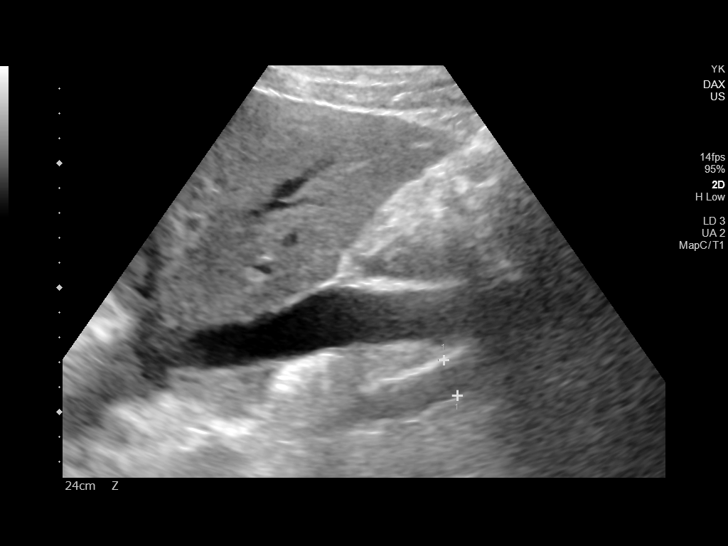
[im 65/112]
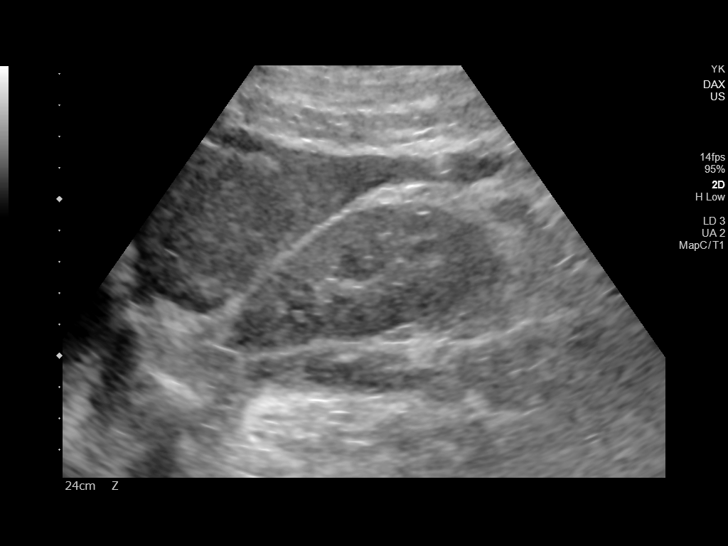
[im 75/112]
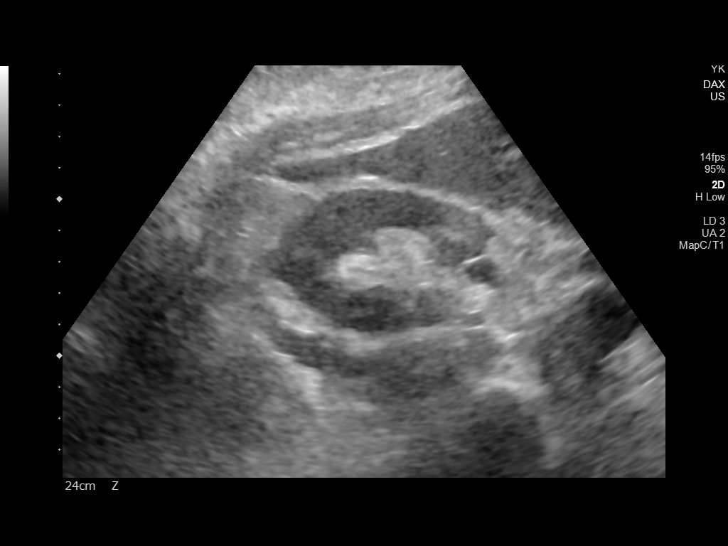
[im 84/112]
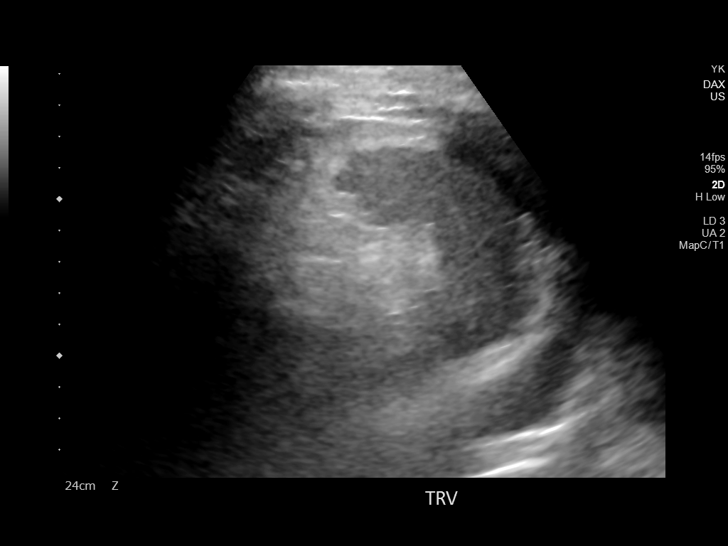
[im 93/112]
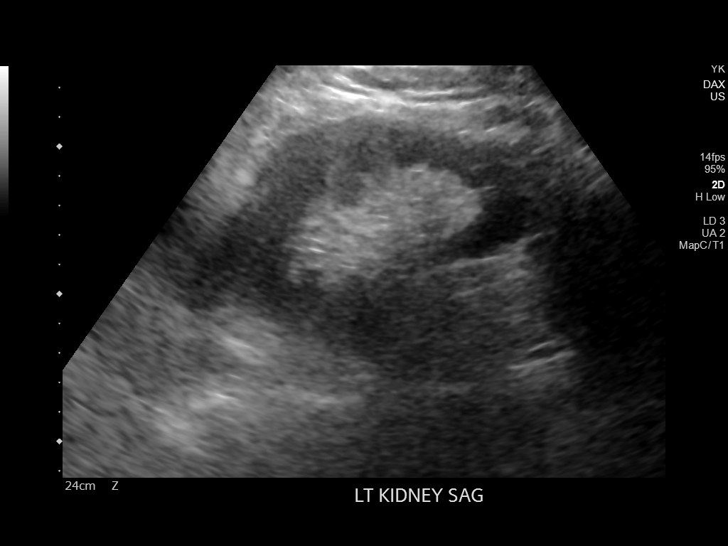
[im 102/112]
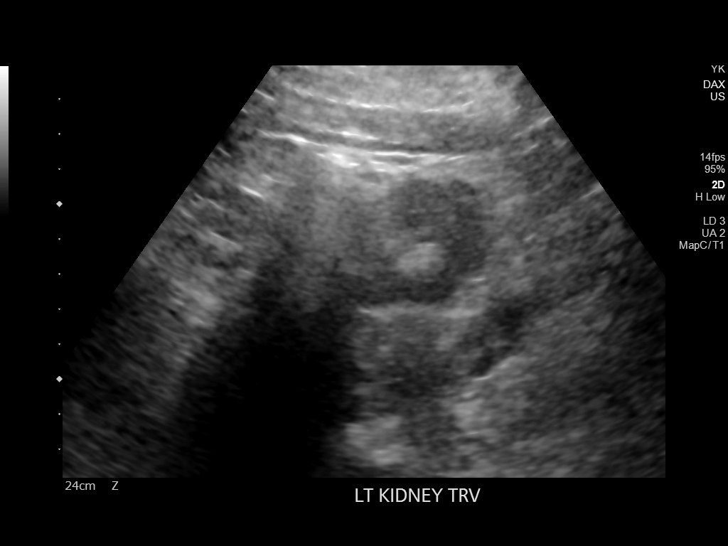
[im 112/112]
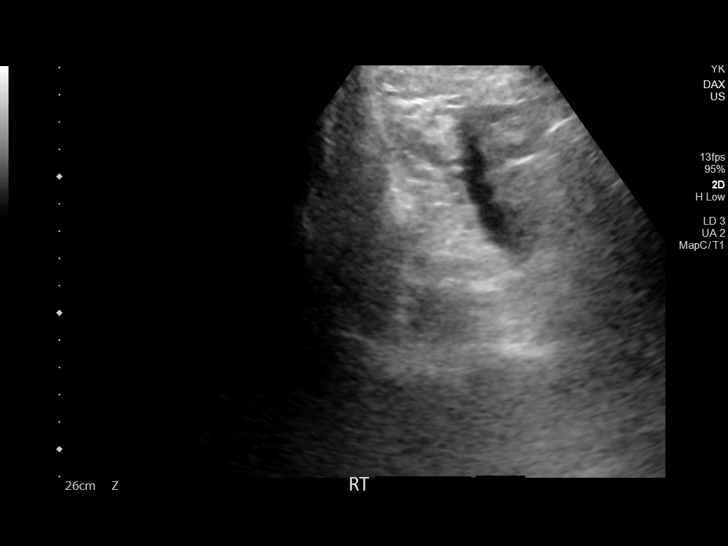

[13 of 25 positions shown; findings below may reference images not displayed]

FINDINGS: Gallbladder: No gallstones are evident. There is thickening of the
gallbladder wall with questionable mild edema within the wall. No
pericholecystic fluid evident. No sonographic Murphy sign noted by
sonographer.

Common bile duct: Diameter: 3 mm. No intrahepatic, common hepatic,
or common bile duct dilatation.

Liver: No focal lesion identified. Within normal limits in
parenchymal echogenicity. Portal vein is patent on color Doppler
imaging with normal direction of blood flow towards the liver.

IVC: No abnormality visualized.

Pancreas: There is no appreciable pancreatic mass or inflammatory
focus.

Spleen: Size and appearance within normal limits.

Right Kidney: Length: 10.0 cm. Echogenicity within normal limits. No
mass or hydronephrosis visualized.

Left Kidney: Length: 10.9 cm. Echogenicity within normal limits. No
mass or hydronephrosis visualized.

Abdominal aorta: No aneurysm visualized.

Other findings: Pleural effusions are noted bilaterally. There is
slight ascites.
IMPRESSION: 1.  Slight ascites.  Pleural effusions bilaterally.

2. Thickened gallbladder wall noted. No gallstones evident.
Gallbladder wall thickening may be seen with ascites. Potential
degree of acalculus cholecystitis must be of concern in this
circumstance, however. In this regard, correlation with nuclear
medicine hepatobiliary imaging study to assess for cystic duct
patency may be advisable.

3.  Study otherwise unremarkable.

## 2024-03-12 NOTE — Procedures (Signed)
Mask fit
